# Patient Record
Sex: Female | Born: 1995 | Race: Black or African American | Hispanic: No | Marital: Single | Smoking: Never smoker
Health system: Southern US, Community
[De-identification: ages and names within clinical notes are randomized; demographics above are authoritative.]

## PROBLEM LIST (undated history)

## (undated) DIAGNOSIS — G35 Multiple sclerosis: Secondary | ICD-10-CM

## (undated) DIAGNOSIS — G43909 Migraine, unspecified, not intractable, without status migrainosus: Secondary | ICD-10-CM

## (undated) DIAGNOSIS — J45909 Unspecified asthma, uncomplicated: Secondary | ICD-10-CM

## (undated) DIAGNOSIS — R569 Unspecified convulsions: Secondary | ICD-10-CM

## (undated) HISTORY — PX: WRIST SURGERY: SHX841

## (undated) HISTORY — DX: Multiple sclerosis: G35

---

## 2017-04-16 ENCOUNTER — Emergency Department (HOSPITAL_BASED_OUTPATIENT_CLINIC_OR_DEPARTMENT_OTHER)
Admission: EM | Admit: 2017-04-16 | Discharge: 2017-04-16 | Disposition: A | Discharge status: Home / Self Care | Payer: Medicaid Other | Attending: Emergency Medicine | Admitting: Emergency Medicine

## 2017-04-16 ENCOUNTER — Encounter (HOSPITAL_BASED_OUTPATIENT_CLINIC_OR_DEPARTMENT_OTHER): Payer: Self-pay

## 2017-04-16 DIAGNOSIS — L02411 Cutaneous abscess of right axilla: Secondary | ICD-10-CM | POA: Diagnosis present

## 2017-04-16 MED ORDER — HYDROCODONE-ACETAMINOPHEN 5-325 MG PO TABS
2.0000 | ORAL_TABLET | Freq: Once | ORAL | Status: AC
  Administered 2017-04-16: 2 via ORAL
  Filled 2017-04-16: qty 2

## 2017-04-16 MED ORDER — CEPHALEXIN 250 MG PO CAPS
250.0000 mg | ORAL_CAPSULE | Freq: Once | ORAL | Status: AC
  Administered 2017-04-16: 250 mg via ORAL
  Filled 2017-04-16: qty 1

## 2017-04-16 MED ORDER — LIDOCAINE-EPINEPHRINE (PF) 2 %-1:200000 IJ SOLN
10.0000 mL | Freq: Once | INTRAMUSCULAR | Status: AC
  Administered 2017-04-16: 10 mL
  Filled 2017-04-16: qty 10

## 2017-04-16 MED ORDER — CEPHALEXIN 250 MG PO CAPS: 250.0000 mg | ORAL_CAPSULE | Freq: Four times a day (QID) | ORAL | 0 refills | Status: DC

## 2017-04-16 NOTE — ED Triage Notes (Signed)
c/o abscess x 2 to right axilla x 2 days-NAD-steady gait

## 2017-04-16 NOTE — ED Provider Notes (Signed)
[facility] Note   CSN: [id number] Arrival date & time: 04/16/17  1810  By signing my name below, I, Freida Busman, attest that this documentation has been prepared under the direction and in the presence of Roxy Horseman, PA-C. Electronically Signed: Freida Busman, Scribe. 04/16/2017. 8:23 PM.  History   Chief Complaint Chief Complaint  Patient presents with  . Abscess    The history is provided by the patient. No language interpreter was used.   HPI Comments:  Kathleen Moss is a 21 y.o. female who presents to the Emergency Department complaining of a painful "boil" to the right axilla x a few days. Pt reports h/o same, has not needed I&D in the past. Pt has no other acute complaints or associated symptoms at this time; no fever. No alleviating factors noted.   History reviewed. No pertinent past medical history.  There are no active problems to display for this patient.   Past Surgical History:  Procedure Laterality Date  . WRIST SURGERY      OB History    No data available       Home Medications    Prior to Admission medications   Not on File    Family History No family history on file.  Social History Social History  Substance Use Topics  . Smoking status: Never Smoker  . Smokeless tobacco: Never Used  . Alcohol use No     Allergies   Patient has no allergy information on record.   Review of Systems Review of Systems  Constitutional: Negative for fever.  Skin: Positive for rash (boil).   Physical Exam Updated Vital Signs BP 112/63 (BP Location: Left Arm)   Pulse 90   Temp 99.8 F (37.7 C) (Oral)   Resp 18   Ht  (1.499 m)   Wt 117 lb (53.1 kg)   LMP 03/25/2017   SpO2 100%   BMI 23.63 kg/m   Physical Exam  Constitutional: She is oriented to person, place, and time. She appears well-developed and well-nourished. No distress.  HENT:  Head: Normocephalic and atraumatic.  Eyes: Conjunctivae are normal.    Cardiovascular: Normal rate.   Pulmonary/Chest: Effort normal.  Abdominal: She exhibits no distension.  Neurological: She is alert and oriented to person, place, and time.  Skin: Skin is warm and dry.  1x3 cm right axillary abscess with mild surrounding cellulitis  Psychiatric: She has a normal mood and affect.  Nursing note and vitals reviewed.    ED Treatments / Results  DIAGNOSTIC STUDIES:  Oxygen Saturation is 100% on RA, normal by my interpretation.    COORDINATION OF CARE:  8:08 PM Discussed treatment plan with pt at bedside and pt agreed to plan.  Labs (all labs ordered are listed, but only abnormal results are displayed) Labs Reviewed - No data to display  EKG  EKG Interpretation None       Radiology No results found.  Procedures .Marland KitchenIncision and Drainage Date/Time: 04/16/2017 8:10 PM Performed by: Roxy Horseman Authorized by: Roxy Horseman   Consent:    Consent obtained:  Verbal   Consent given by:  Patient Location:    Type:  Abscess   Location: right axilla. Anesthesia (see MAR for exact dosages):    Anesthesia method:  Local infiltration   Local anesthetic:  Lidocaine 2% WITH epi Procedure details:    Incision types:  Single straight   Scalpel blade:  11   Wound management:  Probed and deloculated   Drainage:  Purulent   Drainage amount:  Moderate   Packing materials:  1/4 in iodoform gauze Post-procedure details:    Patient tolerance of procedure:  Tolerated well, no immediate complications    (including critical care time)  Medications Ordered in ED Medications  HYDROcodone-acetaminophen (NORCO/VICODIN) 5-325 MG per tablet 2 tablet (not administered)  lidocaine-EPINEPHrine (XYLOCAINE W/EPI) 2 %-1:200000 (PF) injection 10 mL (not administered)     Initial Impression / Assessment and Plan / ED Course  I have reviewed the triage vital signs and the nursing notes.  Pertinent labs & imaging results that were available during my  care of the patient were reviewed by me and considered in my medical decision making (see chart for details).      Patient with skin abscess. Incision and drainage performed in the ED today.  Packing placed. Wound recheck in 2 days. Supportive care and return precautions discussed.  Pt sent home with Keflex. The patient appears reasonably screened and/or stabilized for discharge and I doubt any other emergent medical condition requiring further screening, evaluation, or treatment in the ED prior to discharge.    Final Clinical Impressions(s) / ED Diagnoses   Final diagnoses:  Abscess of axilla, right    New Prescriptions Discharge Medication List as of 04/16/2017  9:11 PM    START taking these medications   Details  cephALEXin (KEFLEX) 250 MG capsule Take 1 capsule (250 mg total) by mouth 4 (four) times daily., Starting Mon 04/16/2017, Print       I personally performed the services described in this documentation, which was scribed in my presence. The recorded information has been reviewed and is accurate.       Roxy Horseman, PA-C 04/16/17 2251    Melene Plan, DO 04/16/17 2351

## 2017-05-08 ENCOUNTER — Emergency Department (HOSPITAL_BASED_OUTPATIENT_CLINIC_OR_DEPARTMENT_OTHER): Payer: Medicaid Other

## 2017-05-08 ENCOUNTER — Encounter (HOSPITAL_BASED_OUTPATIENT_CLINIC_OR_DEPARTMENT_OTHER): Payer: Self-pay | Admitting: Emergency Medicine

## 2017-05-08 ENCOUNTER — Emergency Department (HOSPITAL_BASED_OUTPATIENT_CLINIC_OR_DEPARTMENT_OTHER)
Admission: EM | Admit: 2017-05-08 | Discharge: 2017-05-08 | Disposition: A | Discharge status: Home / Self Care | Payer: Medicaid Other | Attending: Emergency Medicine | Admitting: Emergency Medicine

## 2017-05-08 DIAGNOSIS — M79661 Pain in right lower leg: Secondary | ICD-10-CM | POA: Insufficient documentation

## 2017-05-08 NOTE — ED Provider Notes (Signed)
4:18 PM Patient seen with Maczis PA-C. Patient with several days of right calf tenderness. No DVT risk factors.   No gross edema on exam. Patient is diffusely tender.  Ultrasound demonstrates no DVT. Patient to be discharged home with conservative measures.  BP 117/67 (BP Location: Right Arm)   Pulse 80   Temp 98.5 F (36.9 C) (Oral)   Resp 16   Ht 4\' 11"  (1.499 m)   Wt 53.1 kg (117 lb)   LMP 04/25/2017   SpO2 100%   BMI 23.63 kg/m     Renne CriglerGeiple, Noa Galvao, PA-C 05/08/17 1619    Shaune PollackIsaacs, Cameron, MD 05/09/17 [phone]

## 2017-05-08 NOTE — ED Triage Notes (Signed)
Pain to R calf since Sunday, no injury.

## 2017-05-08 NOTE — Discharge Instructions (Signed)
The ultrasound did not show evidence of blood clot. He can manage her pain with ibuprofen over-the-counter, rest, ice, elevation. Follow-up with your PCP for persistent symptoms. He can return to the emergency department if symptoms worsen or new worrisome symptoms develop.

## 2017-05-08 NOTE — ED Provider Notes (Signed)
[facility]   CSN: [id number] Arrival date & time: 05/08/17  1247     History   Chief [id number] Chief Complaint  Patient presents with  . Leg Pain    HPI Kathleen Moss is a 21 y.o. female with no significant past medical history [facility] pain that onset Sunday. The patient notes that she was walking at church when the pain began. She is unable to describe the pain. The pain is worse with ambulation, pressure, movement, rated 8 out of 10. She has not taken anything for the pain. She denies recent travel by plane, long travel in car, immobilization, recent surgery, cancer, hormone replacement therapy, birth control, history of blood clot, trauma, smoking history, IV drug use.  HPI  History reviewed. No pertinent past medical history.  There are no active problems to display for this patient.   Past Surgical History:  Procedure Laterality Date  . WRIST SURGERY      OB History    No data available       Home Medications    Prior to Admission medications   Medication Sig Start Date End Date Taking? Authorizing Provider  cephALEXin (KEFLEX) 250 MG capsule Take 1 capsule (250 mg total) by mouth 4 (four) times daily. 04/16/17   Roxy HorsemanBrowning, Robert, PA-C    Family History No family history on file.  Social History Social History  Substance Use Topics  . Smoking status: Never Smoker  . Smokeless tobacco: Never Used  . Alcohol use No     Allergies   Patient has no known allergies.   Review of Systems Review of Systems  All other systems reviewed and are negative.    Physical Exam Updated Vital Signs BP 117/67 (BP Location: Right Arm)   Pulse 80   Temp 98.5 F (36.9 C) (Oral)   Resp 16   Ht 4\' 11"  (1.499 m)   Wt 53.1 kg (117 lb)   LMP 04/25/2017   SpO2 100%   BMI 23.63 kg/m   Physical Exam  Constitutional: She appears well-developed and well-nourished.  HENT:  Head:  Normocephalic and atraumatic.  Eyes: Conjunctivae are normal. Right eye exhibits no discharge. Left eye exhibits no discharge. No scleral icterus.  Cardiovascular: Normal rate, regular rhythm and normal heart sounds.   Pulmonary/Chest: Effort normal and breath sounds normal. No respiratory distress.  Musculoskeletal:       Right knee: Normal.       Left knee: Normal.       Right ankle: Normal.       Left ankle: Normal.       Right lower leg: She exhibits tenderness and swelling. She exhibits no edema and no deformity.       Left lower leg: Normal.  Neurological: She is alert.  Skin: Skin is warm and dry. No erythema. No pallor.  Psychiatric: She has a normal mood and affect.  Nursing note and vitals reviewed.    ED Treatments / Results  Labs (all labs ordered are listed, but only abnormal results are displayed) Labs Reviewed - No data to display  EKG  EKG Interpretation None       Radiology [place] Venous Img Lower Unilateral Right  Result Date: 05/08/2017 CLINICAL DATA:  21 y/o F; right calf and knee pain with swelling for 3 days. EXAM: RIGHT LOWER EXTREMITY VENOUS DOPPLER ULTRASOUND TECHNIQUE: Gray-scale sonography with graded compression, as well as color Doppler and duplex ultrasound were  performed to evaluate the lower extremity deep venous systems from the level of the common femoral vein and including the common femoral, femoral, profunda femoral, popliteal and calf veins including the posterior tibial, peroneal and gastrocnemius veins when visible. The superficial great saphenous vein was also interrogated. Spectral Doppler was utilized to evaluate flow at rest and with distal augmentation maneuvers in the common femoral, femoral and popliteal veins. COMPARISON:  None. FINDINGS: Contralateral Common Femoral Vein: Respiratory phasicity is normal and symmetric with the symptomatic side. No evidence of thrombus. Normal compressibility. Common Femoral Vein: No evidence of thrombus.  Normal compressibility, respiratory phasicity and response to augmentation. Saphenofemoral Junction: No evidence of thrombus. Normal compressibility and flow on color Doppler imaging. Profunda Femoral Vein: No evidence of thrombus. Normal compressibility and flow on color Doppler imaging. Femoral Vein: No evidence of thrombus. Normal compressibility, respiratory phasicity and response to augmentation. Popliteal Vein: No evidence of thrombus. Normal compressibility, respiratory phasicity and response to augmentation. Calf Veins: No evidence of thrombus. Normal compressibility and flow on color Doppler imaging. Superficial Great Saphenous Vein: No evidence of thrombus. Normal compressibility and flow on color Doppler imaging. Venous Reflux:  None. Other Findings:  None. IMPRESSION: No evidence of DVT within the right lower extremity. Electronically Signed   By: Mitzi Hansen M.D.   On: 05/08/2017 16:16    Procedures Procedures (including critical care time)  Medications Ordered in ED Medications - No data to display   Initial Impression / Assessment and Plan / ED Course  I have reviewed the triage vital signs and the nursing notes.  Pertinent labs & imaging results that were available during my care of the patient were reviewed by me and considered in my medical decision making (see chart for details).     Patient's history suspicious for DVT. Ordered a Doppler ultrasound of lower extremity Which did not reveal evidence of a DVT within the right lower extremity. Discussed these findings with the patient. Advised the patient to manage her pain with ibuprofen, rest, ice, elevation. The patient stated understanding. Advised her to follow-up with her PCP for persistent symptoms. She can return to the emergency department for worsening or new worrisome symptoms.  Final Clinical Impressions(s) / ED Diagnoses   Final diagnoses:  Right calf pain    New Prescriptions New Prescriptions   No  medications on file     [name] 05/08/17 1625    Charlynne Pander, MD 05/09/17 [phone]

## 2017-05-27 ENCOUNTER — Emergency Department (HOSPITAL_BASED_OUTPATIENT_CLINIC_OR_DEPARTMENT_OTHER): Payer: Medicaid Other

## 2017-05-27 ENCOUNTER — Encounter (HOSPITAL_BASED_OUTPATIENT_CLINIC_OR_DEPARTMENT_OTHER): Payer: Self-pay | Admitting: Emergency Medicine

## 2017-05-27 ENCOUNTER — Emergency Department (HOSPITAL_BASED_OUTPATIENT_CLINIC_OR_DEPARTMENT_OTHER)
Admission: EM | Admit: 2017-05-27 | Discharge: 2017-05-27 | Disposition: A | Discharge status: Home / Self Care | Payer: Medicaid Other | Attending: Emergency Medicine | Admitting: Emergency Medicine

## 2017-05-27 DIAGNOSIS — A598 Trichomoniasis of other sites: Secondary | ICD-10-CM | POA: Diagnosis not present

## 2017-05-27 DIAGNOSIS — R69 Illness, unspecified: Secondary | ICD-10-CM

## 2017-05-27 DIAGNOSIS — A599 Trichomoniasis, unspecified: Secondary | ICD-10-CM

## 2017-05-27 DIAGNOSIS — R05 Cough: Secondary | ICD-10-CM | POA: Diagnosis present

## 2017-05-27 DIAGNOSIS — J111 Influenza due to unidentified influenza virus with other respiratory manifestations: Secondary | ICD-10-CM

## 2017-05-27 DIAGNOSIS — R509 Fever, unspecified: Secondary | ICD-10-CM | POA: Diagnosis not present

## 2017-05-27 LAB — URINALYSIS, ROUTINE W REFLEX MICROSCOPIC
GLUCOSE, UA: NEGATIVE mg/dL
Ketones, ur: 15 mg/dL — AB
Nitrite: NEGATIVE
Protein, ur: NEGATIVE mg/dL
SPECIFIC GRAVITY, URINE: 1.038 — AB (ref 1.005–1.030)
pH: 5.5 (ref 5.0–8.0)

## 2017-05-27 LAB — URINALYSIS, MICROSCOPIC (REFLEX)

## 2017-05-27 LAB — WET PREP, GENITAL
Clue Cells Wet Prep HPF POC: NONE SEEN
SPERM: NONE SEEN
YEAST WET PREP: NONE SEEN

## 2017-05-27 LAB — PREGNANCY, URINE: PREG TEST UR: NEGATIVE

## 2017-05-27 MED ORDER — LIDOCAINE HCL (PF) 1 % IJ SOLN
INTRAMUSCULAR | Status: AC
  Administered 2017-05-27: 5 mL
  Filled 2017-05-27: qty 5

## 2017-05-27 MED ORDER — ONDANSETRON 4 MG PO TBDP
4.0000 mg | ORAL_TABLET | Freq: Once | ORAL | Status: AC
  Administered 2017-05-27: 4 mg via ORAL
  Filled 2017-05-27: qty 1

## 2017-05-27 MED ORDER — CEFTRIAXONE SODIUM 250 MG IJ SOLR
250.0000 mg | Freq: Once | INTRAMUSCULAR | Status: AC
  Administered 2017-05-27: 250 mg via INTRAMUSCULAR
  Filled 2017-05-27: qty 250

## 2017-05-27 MED ORDER — METRONIDAZOLE 500 MG PO TABS: 500.0000 mg | ORAL_TABLET | Freq: Two times a day (BID) | ORAL | 0 refills | Status: DC

## 2017-05-27 MED ORDER — AZITHROMYCIN 250 MG PO TABS
1000.0000 mg | ORAL_TABLET | Freq: Once | ORAL | Status: AC
  Administered 2017-05-27: 1000 mg via ORAL
  Filled 2017-05-27: qty 4

## 2017-05-27 NOTE — ED Triage Notes (Signed)
Patient states that she has had fever and cough x 1 week. Reports intermittent fever that is controlled with OTC medications. The patient also reports vomiting last night and lower pelvic pain

## 2017-05-27 NOTE — Discharge Instructions (Signed)
Take tylenol 2 pills 4 times a day and motrin 4 pills 3 times a day.  Drink plenty of fluids.  Return for worsening shortness of breath, headache, confusion. Follow up with your family doctor.   

## 2017-05-27 NOTE — ED Provider Notes (Signed)
[facility]   CSN: [id number] Arrival date & time: 05/27/17  4098     History   Chief Complaint Chief Complaint  Patient presents with  . Cough    HPI Kathleen Moss is a 21 y.o. female.  21 yo F with a chief complaint of cough congestion fevers chills and myalgias. Going on for the past 5 days or so. Feels that it's slightly worsening. She has also complaining of pelvic discharge. She did just start her menstrual cycle so is not sure if it continued. Is having some lower pelvic pain. Denies flank pain. Had an episode of vomiting and diarrhea this morning. Multiple members in the family with the same illness.   The history is provided by the patient.  Cough  This is a new problem. The current episode started more than 2 days ago. The problem occurs constantly. The problem has been rapidly worsening. The cough is non-productive. The maximum temperature recorded prior to her arrival was 102 to 102.9 F. The fever has been present for 3 to 4 days. Associated symptoms include chills and myalgias. Pertinent negatives include no chest pain, no headaches, no rhinorrhea, no shortness of breath, no wheezing and no eye redness. She has tried nothing for the symptoms. The treatment provided no relief. She is not a smoker.    History reviewed. No pertinent past medical history.  There are no active problems to display for this patient.   Past Surgical History:  Procedure Laterality Date  . WRIST SURGERY      OB History    No data available       Home Medications    Prior to Admission medications   Medication Sig Start Date End Date Taking? Authorizing Provider  cephALEXin (KEFLEX) 250 MG capsule Take 1 capsule (250 mg total) by mouth 4 (four) times daily. 04/16/17   Roxy Horseman, PA-C  metroNIDAZOLE (FLAGYL) 500 MG tablet Take 1 tablet (500 mg total) by mouth 2 (two) times daily. One po bid x 7 days 05/27/17   Melene Plan, DO    Family  History History reviewed. No pertinent family history.  Social History Social History  Substance Use Topics  . Smoking status: Never Smoker  . Smokeless tobacco: Never Used  . Alcohol use No     Allergies   Patient has no known allergies.   Review of Systems Review of Systems  Constitutional: Positive for chills and fever.  HENT: Negative for congestion and rhinorrhea.   Eyes: Negative for redness and visual disturbance.  Respiratory: Positive for cough. Negative for shortness of breath and wheezing.   Cardiovascular: Negative for chest pain and palpitations.  Gastrointestinal: Negative for nausea and vomiting.  Genitourinary: Positive for pelvic pain, vaginal bleeding and vaginal discharge. Negative for dysuria and urgency.  Musculoskeletal: Positive for myalgias. Negative for arthralgias.  Skin: Negative for pallor and wound.  Neurological: Negative for dizziness and headaches.     Physical Exam Updated Vital Signs BP 113/66 (BP Location: Right Arm)   Pulse 88   Temp 98.8 F (37.1 C) (Oral)   Resp 18   Ht 4\' 11"  (1.499 m)   Wt 53.5 kg (118 lb)   LMP 05/22/2017   SpO2 100%   BMI 23.83 kg/m   Physical Exam  Constitutional: She is oriented to person, place, and time. She appears well-developed and well-nourished. No distress.  HENT:  Head: Normocephalic and atraumatic.  Swollen turbinates, posterior nasal drip, no noted sinus ttp, tm normal  bilaterally.    Eyes: EOM are normal. Pupils are equal, round, and reactive to light.  Neck: Normal range of motion. Neck supple.  Cardiovascular: Normal rate and regular rhythm.  Exam reveals no gallop and no friction rub.   No murmur heard. Pulmonary/Chest: Effort normal. She has no wheezes. She has no rales.  Abdominal: Soft. She exhibits no distension and no mass. There is no tenderness. There is no guarding.  Musculoskeletal: She exhibits no edema or tenderness.  Neurological: She is alert and oriented to person,  place, and time.  Skin: Skin is warm and dry. She is not diaphoretic.  Psychiatric: She has a normal mood and affect. Her behavior is normal.  Nursing note and vitals reviewed.    ED Treatments / Results  Labs (all labs ordered are listed, but only abnormal results are displayed) Labs Reviewed  WET PREP, GENITAL - Abnormal; Notable for the following:       Result Value   Trich, Wet Prep PRESENT (*)    WBC, Wet Prep HPF POC MANY (*)    All other components within normal limits  URINALYSIS, ROUTINE W REFLEX MICROSCOPIC - Abnormal; Notable for the following:    Specific Gravity, Urine 1.038 (*)    Hgb urine dipstick SMALL (*)    Bilirubin Urine SMALL (*)    Ketones, ur 15 (*)    Leukocytes, UA TRACE (*)    All other components within normal limits  URINALYSIS, MICROSCOPIC (REFLEX) - Abnormal; Notable for the following:    Bacteria, UA MANY (*)    Squamous Epithelial / LPF 6-30 (*)    All other components within normal limits  PREGNANCY, URINE  RPR  HIV ANTIBODY (ROUTINE TESTING)  GC/CHLAMYDIA PROBE AMP ([place]) NOT AT [facility]    EKG  EKG Interpretation None       Radiology Dg Chest 2 View  Result Date: 05/27/2017 CLINICAL DATA:  Productive cough and fever for 4 days. EXAM: CHEST  2 VIEW COMPARISON:  None. FINDINGS: The lungs are clear. Heart size is normal. There is no pneumothorax or pleural fluid. No bony abnormality. IMPRESSION: Negative chest. Electronically Signed   By: Drusilla Kanner M.D.   On: 05/27/2017 10:28    Procedures Procedures (including critical care time)  Medications Ordered in ED Medications  ondansetron (ZOFRAN-ODT) disintegrating tablet 4 mg (4 mg Oral Given 05/27/17 1154)  cefTRIAXone (ROCEPHIN) injection 250 mg (250 mg Intramuscular Given 05/27/17 1216)  azithromycin (ZITHROMAX) tablet 1,000 mg (1,000 mg Oral Given 05/27/17 1216)  lidocaine (PF) (XYLOCAINE) 1 % injection (5 mLs  Given 05/27/17 1216)     Initial Impression / Assessment and  Plan / ED Course  I have reviewed the triage vital signs and the nursing notes.  Pertinent labs & imaging results that were available during my care of the patient were reviewed by me and considered in my medical decision making (see chart for details).     21 yo F  With an influenza-like illness. Chest x-ray obtained due to length of symptoms. Patient also complaining of pelvic complaints.   Trich on wet prep, treat for GC.   12:51 PM:  I have discussed the diagnosis/risks/treatment options with the patient and family and believe the pt to be eligible for discharge home to follow-up with PCP. We also discussed returning to the ED immediately if new or worsening sx occur. We discussed the sx which are most concerning (e.g., sudden worsening pain, fever, inability to tolerate by mouth) that necessitate  immediate return. Medications administered to the patient during their visit and any new prescriptions provided to the patient are listed below.  Medications given during this visit Medications  ondansetron (ZOFRAN-ODT) disintegrating tablet 4 mg (4 mg Oral Given 05/27/17 1154)  cefTRIAXone (ROCEPHIN) injection 250 mg (250 mg Intramuscular Given 05/27/17 1216)  azithromycin (ZITHROMAX) tablet 1,000 mg (1,000 mg Oral Given 05/27/17 1216)  lidocaine (PF) (XYLOCAINE) 1 % injection (5 mLs  Given 05/27/17 1216)     The patient appears reasonably screen and/or stabilized for discharge and I doubt any other medical condition or other [facility] requiring further screening, evaluation, or treatment in the ED at this time prior to discharge.      Final Clinical Impressions(s) / ED Diagnoses   Final diagnoses:  Trichomonas infection  Influenza-like illness    New Prescriptions New Prescriptions   METRONIDAZOLE (FLAGYL) 500 MG TABLET    Take 1 tablet (500 mg total) by mouth 2 (two) times daily. One po bid x 7 days     Melene PlanFloyd, Blayke Cordrey, DO 05/27/17 1251

## 2017-05-28 LAB — HIV ANTIBODY (ROUTINE TESTING W REFLEX): HIV Screen 4th Generation wRfx: NONREACTIVE

## 2017-05-28 LAB — RPR: RPR Ser Ql: NONREACTIVE

## 2017-05-28 LAB — GC/CHLAMYDIA PROBE AMP (~~LOC~~) NOT AT ARMC
CHLAMYDIA, DNA PROBE: NEGATIVE
Neisseria Gonorrhea: NEGATIVE

## 2017-10-11 ENCOUNTER — Emergency Department (HOSPITAL_BASED_OUTPATIENT_CLINIC_OR_DEPARTMENT_OTHER)
Admission: EM | Admit: 2017-10-11 | Discharge: 2017-10-11 | Disposition: A | Discharge status: Home / Self Care | Payer: Self-pay | Attending: Emergency Medicine | Admitting: Emergency Medicine

## 2017-10-11 ENCOUNTER — Encounter (HOSPITAL_BASED_OUTPATIENT_CLINIC_OR_DEPARTMENT_OTHER): Payer: Self-pay | Admitting: Emergency Medicine

## 2017-10-11 DIAGNOSIS — G43009 Migraine without aura, not intractable, without status migrainosus: Secondary | ICD-10-CM | POA: Insufficient documentation

## 2017-10-11 DIAGNOSIS — Z79899 Other long term (current) drug therapy: Secondary | ICD-10-CM | POA: Insufficient documentation

## 2017-10-11 DIAGNOSIS — J45909 Unspecified asthma, uncomplicated: Secondary | ICD-10-CM | POA: Insufficient documentation

## 2017-10-11 HISTORY — DX: Migraine, unspecified, not intractable, without status migrainosus: G43.909

## 2017-10-11 HISTORY — DX: Unspecified asthma, uncomplicated: J45.909

## 2017-10-11 MED ORDER — DIPHENHYDRAMINE HCL 25 MG PO CAPS
25.0000 mg | ORAL_CAPSULE | Freq: Once | ORAL | Status: AC
  Administered 2017-10-11: 25 mg via ORAL
  Filled 2017-10-11: qty 1

## 2017-10-11 MED ORDER — DIPHENHYDRAMINE HCL 50 MG/ML IJ SOLN: 25.0000 mg | Freq: Once | INTRAMUSCULAR | Status: DC

## 2017-10-11 MED ORDER — ACETAMINOPHEN 500 MG PO TABS
1000.0000 mg | ORAL_TABLET | Freq: Once | ORAL | Status: AC
  Administered 2017-10-11: 1000 mg via ORAL
  Filled 2017-10-11: qty 2

## 2017-10-11 MED ORDER — KETOROLAC TROMETHAMINE 15 MG/ML IJ SOLN
15.0000 mg | Freq: Once | INTRAMUSCULAR | Status: AC
  Administered 2017-10-11: 15 mg via INTRAVENOUS
  Filled 2017-10-11: qty 1

## 2017-10-11 MED ORDER — MAGNESIUM SULFATE 2 GM/50ML IV SOLN
2.0000 g | Freq: Once | INTRAVENOUS | Status: AC
  Administered 2017-10-11: 2 g via INTRAVENOUS
  Filled 2017-10-11: qty 50

## 2017-10-11 MED ORDER — ONDANSETRON HCL 4 MG/2ML IJ SOLN
4.0000 mg | Freq: Once | INTRAMUSCULAR | Status: AC
Start: 2017-10-11 — End: 2017-10-11
  Administered 2017-10-11: 4 mg via INTRAVENOUS
  Filled 2017-10-11: qty 2

## 2017-10-11 MED ORDER — DEXAMETHASONE SODIUM PHOSPHATE 10 MG/ML IJ SOLN
10.0000 mg | Freq: Once | INTRAMUSCULAR | Status: AC
  Administered 2017-10-11: 10 mg via INTRAVENOUS
  Filled 2017-10-11: qty 1

## 2017-10-11 MED ORDER — PROCHLORPERAZINE EDISYLATE 5 MG/ML IJ SOLN
10.0000 mg | Freq: Once | INTRAMUSCULAR | Status: AC
  Administered 2017-10-11: 10 mg via INTRAVENOUS
  Filled 2017-10-11: qty 2

## 2017-10-11 NOTE — ED Triage Notes (Signed)
Pt c/o severe posterior HA since yesterday; took Excedrin at home w/o relief

## 2017-10-11 NOTE — ED Notes (Signed)
Pt vomited approx 3 minutes after receiving Compazine, while Decadron was being administered. Pt denies NV prior to this episode.

## 2017-10-11 NOTE — ED Provider Notes (Signed)
[facility] EMERGENCY DEPARTMENT Provider Note  CSN: [id number] Arrival date & time: 10/11/17 1018  Chief Complaint(s) Migraine  HPI Kathleen Moss is a 21 y.o. female   The history is provided by the patient.  Migraine  This is a recurrent problem. The current episode started yesterday. The problem occurs constantly. The problem has been gradually worsening. Pertinent negatives include no chest pain, no abdominal pain and no shortness of breath. Exacerbated by: light, loud sounds. Nothing relieves the symptoms. Treatments tried: Exedrine. Improvement on treatment: typically improves but has not helped with this headache.   Similar to prior migraine headaches. No recent fevers, chills, URI sx, trauma, focal weakness.   Past Medical History Past Medical History:  Diagnosis Date  . Asthma   . Migraines    There are no active problems to display for this patient.  Home Medication(s) Prior to Admission medications   Medication Sig Start Date End Date Taking? Authorizing Provider  cephALEXin (KEFLEX) 250 MG capsule Take 1 capsule (250 mg total) by mouth 4 (four) times daily. 04/16/17   Roxy HorsemanBrowning, Robert, PA-C  metroNIDAZOLE (FLAGYL) 500 MG tablet Take 1 tablet (500 mg total) by mouth 2 (two) times daily. One po bid x 7 days 05/27/17   Melene PlanFloyd, Dan, DO                                                                                                                                    Past Surgical History Past Surgical History:  Procedure Laterality Date  . WRIST SURGERY     Family History No family history on file.  Social History Social History  Substance Use Topics  . Smoking status: Never Smoker  . Smokeless tobacco: Never Used  . Alcohol use No   Allergies Patient has no known allergies.  Review of Systems Review of Systems  Respiratory: Negative for shortness of breath.   Cardiovascular: Negative for chest pain.  Gastrointestinal: Negative for abdominal pain.    All other systems are reviewed and are negative for acute change except as noted in the HPI  Physical Exam Vital Signs  I have reviewed the triage vital signs BP 122/69 (BP Location: Right Arm)   Pulse 68   Temp 99.2 F (37.3 C) (Oral)   Resp 16   Ht 4\' 11"  (1.499 m)   Wt 54.4 kg (120 lb)   LMP 09/24/2017   SpO2 100%   BMI 24.24 kg/m   Physical Exam  Constitutional: She is oriented to person, place, and time. She appears well-developed and well-nourished. No distress.  HENT:  Head: Normocephalic and atraumatic.  Right Ear: External ear normal.  Left Ear: External ear normal.  Nose: Nose normal.  Eyes: Conjunctivae and EOM are normal. No scleral icterus.  Neck: Normal range of motion and phonation normal.  Cardiovascular: Normal rate and regular rhythm.   Pulmonary/Chest: Effort normal. No stridor. No respiratory distress.  Abdominal: She  exhibits no distension.  Musculoskeletal: Normal range of motion. She exhibits no edema.  Neurological: She is alert and oriented to person, place, and time.  Mental Status:  Alert and oriented to person, place, and time.  Attention and concentration normal.  Speech clear.  Recent memory is intact  Cranial Nerves:  II Visual Fields: Intact to confrontation. Visual fields intact. III, IV, VI: Pupils equal and reactive to light and near. Full eye movement without nystagmus  V Facial Sensation: Normal. No weakness of masticatory muscles  VII: No facial weakness or asymmetry  VIII Auditory Acuity: Grossly normal  IX/X: The uvula is midline; the palate elevates symmetrically  XI: Normal sternocleidomastoid and trapezius strength  XII: The tongue is midline. No atrophy or fasciculations.   Motor System: Muscle Strength: 5/5 and symmetric in the upper and lower extremities. No pronation or drift.  Muscle Tone: Tone and muscle bulk are normal in the upper and lower extremities.   Reflexes: DTRs: 1+ and symmetrical in all four extremities.  No Clonus Coordination: Intact finger-to-nose, heel-to-shin. No tremor.  Sensation: Intact to light touch, and pinprick.  Gait: Routine gait normal.   Skin: She is not diaphoretic.  Psychiatric: She has a normal mood and affect. Her behavior is normal.  Vitals reviewed.   ED Results and Treatments Labs (all labs ordered are listed, but only abnormal results are displayed) Labs Reviewed - No data to display                                                                                                                       EKG  EKG Interpretation  Date/Time:    Ventricular Rate:    PR Interval:    QRS Duration:   QT Interval:    QTC Calculation:   R Axis:     Text Interpretation:        Radiology No results found. Pertinent labs & imaging results that were available during my care of the patient were reviewed by me and considered in my medical decision making (see chart for details).  Medications Ordered in ED Medications  prochlorperazine (COMPAZINE) injection 10 mg (10 mg Intravenous Given 10/11/17 1057)  dexamethasone (DECADRON) injection 10 mg (10 mg Intravenous Given 10/11/17 1101)  acetaminophen (TYLENOL) tablet 1,000 mg (1,000 mg Oral Given 10/11/17 1048)  diphenhydrAMINE (BENADRYL) capsule 25 mg (25 mg Oral Given 10/11/17 1048)  ondansetron (ZOFRAN) injection 4 mg (4 mg Intravenous Given 10/11/17 1112)  magnesium sulfate IVPB 2 g 50 mL (0 g Intravenous Stopped 10/11/17 1156)  ketorolac (TORADOL) 15 MG/ML injection 15 mg (15 mg Intravenous Given 10/11/17 1137)  Procedures Procedures  (including critical care time)  Medical Decision Making / ED Course I have reviewed the nursing notes for this encounter and the patient's prior records (if available in EHR or on provided paperwork).     10:53 AM Typical migraine headache for the pt.  Non focal neuro exam. No recent head trauma. No fever. Doubt meningitis. Doubt intracranial bleed. Doubt IIH. No indication for imaging. Will treat with migraine cocktail and reevaluate.  11:29 AM Minimal improvement with Compazine, Benadryl, Decadron.  Will give patient Toradol and magnesium.   12:06 PM Significant improvement per the patient.  The patient is safe for discharge with strict return precautions.   Final Clinical Impression(s) / ED Diagnoses Final diagnoses:  Migraine without aura and without status migrainosus, not intractable    Disposition: Discharge  Condition: Good  I have discussed the results, Dx and Tx plan with the patient who expressed understanding and agree(s) with the plan. Discharge instructions discussed at great length. The patient was given strict return precautions who verbalized understanding of the instructions. No further questions at time of discharge.    New Prescriptions   No medications on file    Follow Up: Primary care provider  Schedule an appointment as soon as possible for a visit  As needed     This chart was dictated using voice recognition software.  Despite best efforts to proofread,  errors can occur which can change the documentation meaning.   Nira Conn, MD 10/11/17 [id number]

## 2017-12-09 IMAGING — US US EXTREM LOW VENOUS*R*
1 series · 13 of 24 positions shown · non-contrast
Comparison: None.

CLINICAL DATA: 21 y/o F; right calf and knee pain with swelling for
3 days.



[Series 1: us extrem low venous*right* · 0.05mm/px · 13 of 28 slices shown]
[im 1/28]
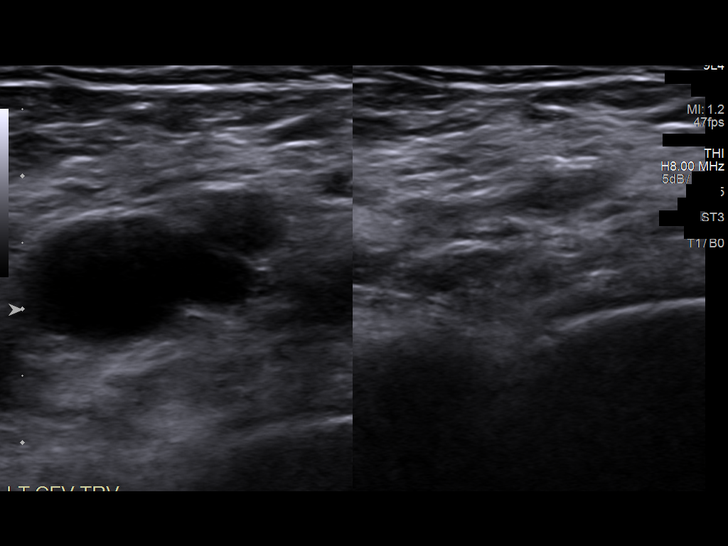
[im 3/28]
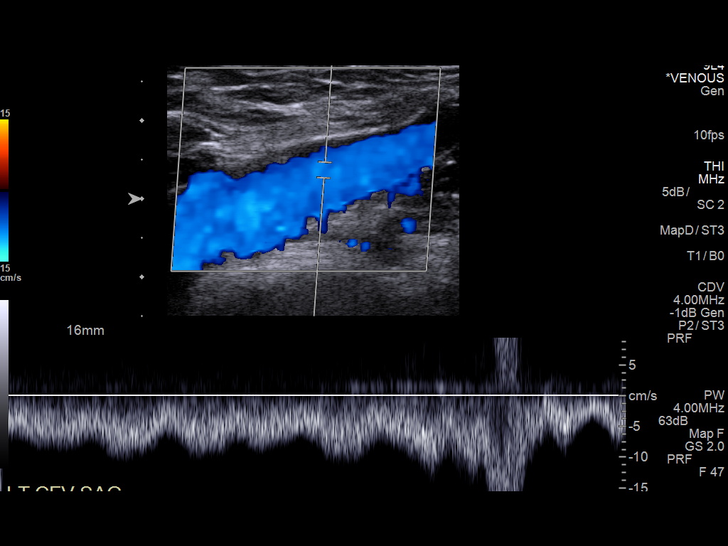
[im 5/28]
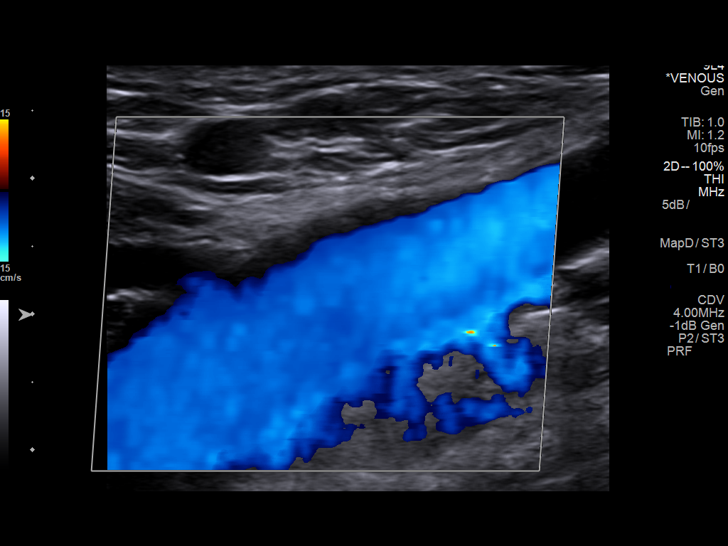
[im 8/28]
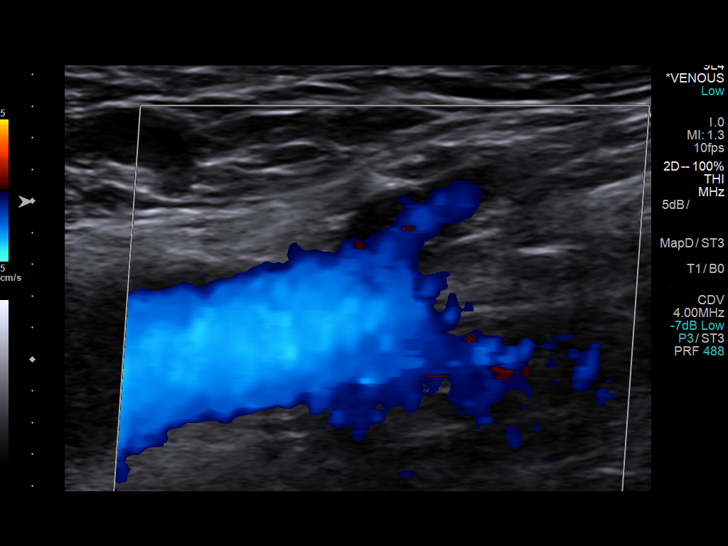
[im 10/28]
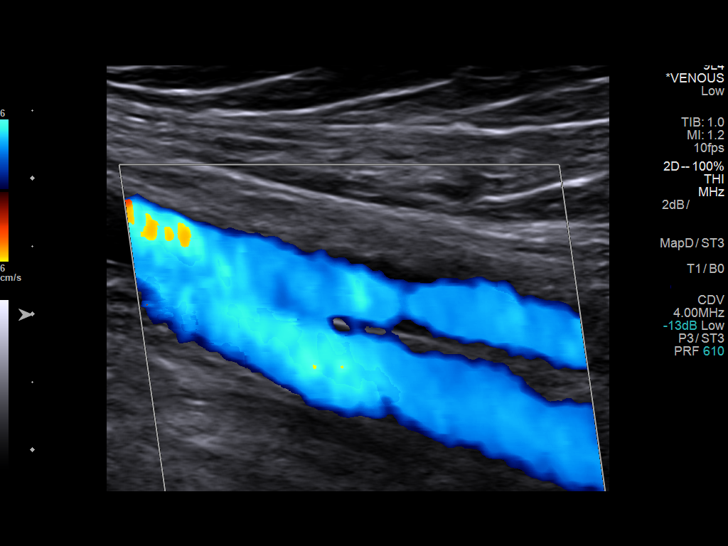
[im 12/28]
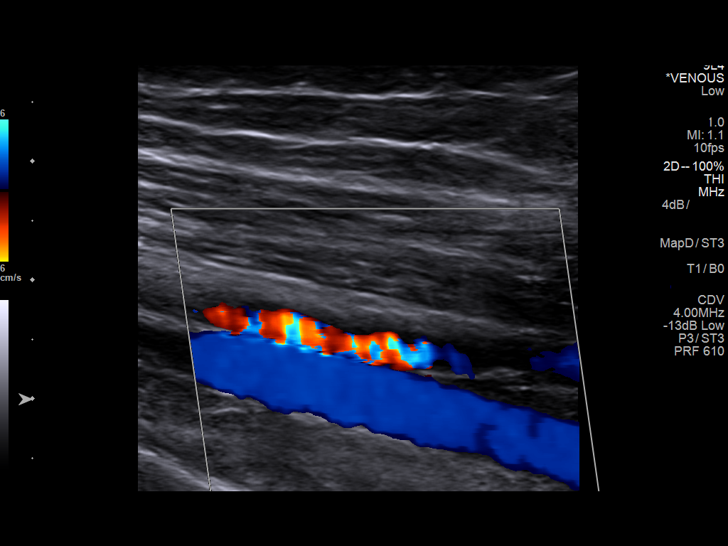
[im 15/28]
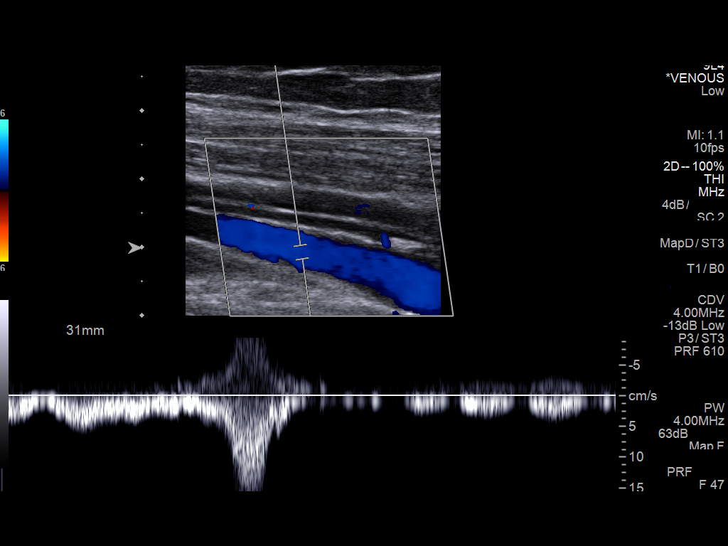
[im 16/28]
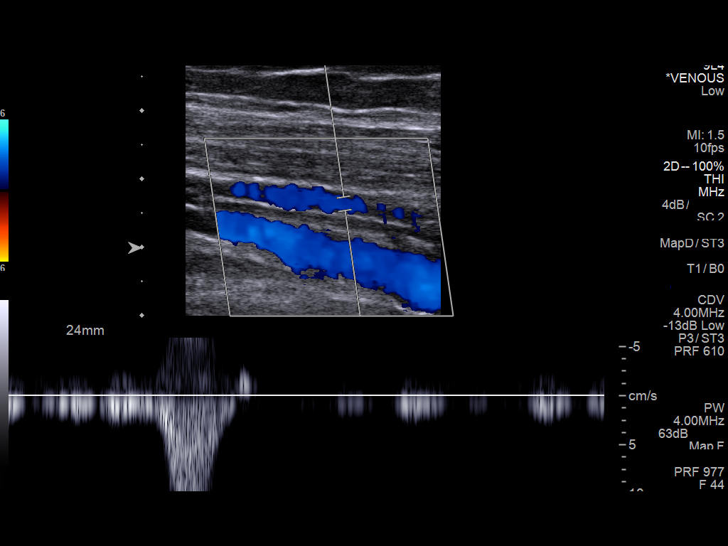
[im 18/28]
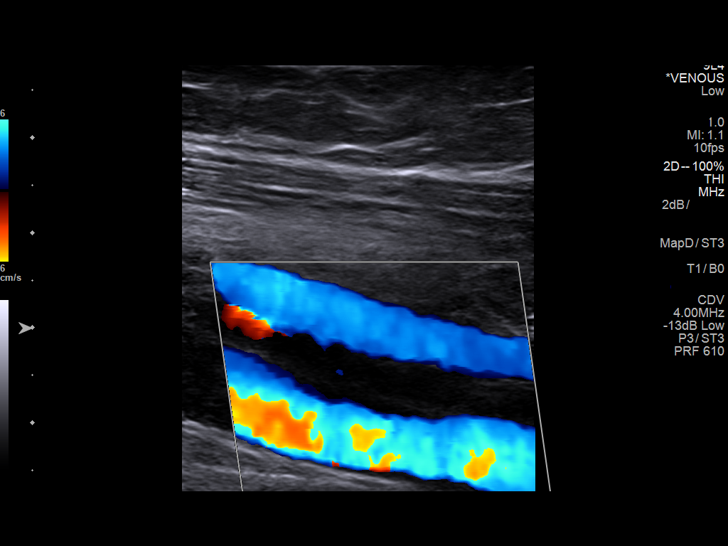
[im 20/28]
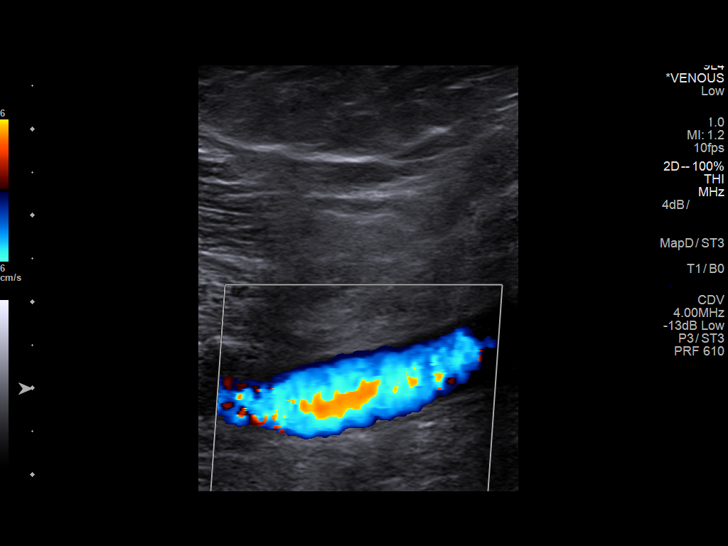
[im 23/28]
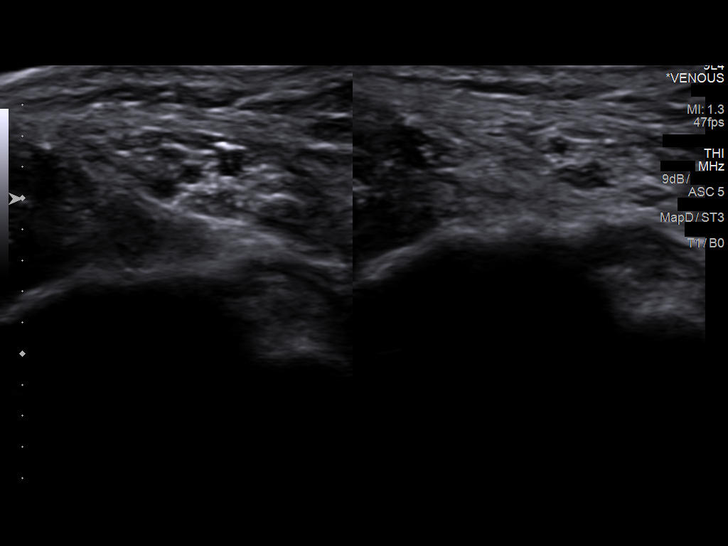
[im 25/28]
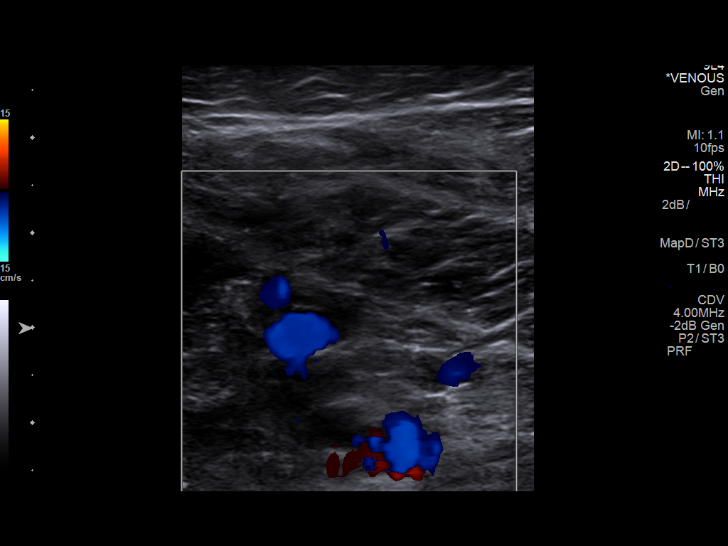
[im 28/28]
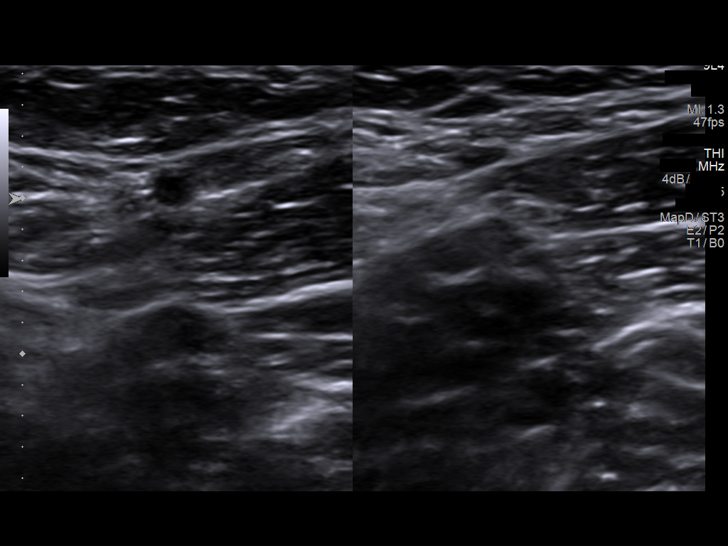

[13 of 24 positions shown; findings below may reference images not displayed]

FINDINGS: Contralateral Common Femoral Vein: Respiratory phasicity is normal
and symmetric with the symptomatic side. No evidence of thrombus.
Normal compressibility.

Common Femoral Vein: No evidence of thrombus. Normal
compressibility, respiratory phasicity and response to augmentation.

Saphenofemoral Junction: No evidence of thrombus. Normal
compressibility and flow on color Doppler imaging.

Profunda Femoral Vein: No evidence of thrombus. Normal
compressibility and flow on color Doppler imaging.

Femoral Vein: No evidence of thrombus. Normal compressibility,
respiratory phasicity and response to augmentation.

Popliteal Vein: No evidence of thrombus. Normal compressibility,
respiratory phasicity and response to augmentation.

Calf Veins: No evidence of thrombus. Normal compressibility and flow
on color Doppler imaging.

Superficial Great Saphenous Vein: No evidence of thrombus. Normal
compressibility and flow on color Doppler imaging.

Venous Reflux:  None.

Other Findings:  None.
IMPRESSION: No evidence of DVT within the right lower extremity.

By: Author Sampayo M.D.

## 2018-01-17 ENCOUNTER — Other Ambulatory Visit: Payer: Self-pay

## 2018-01-17 ENCOUNTER — Encounter (HOSPITAL_BASED_OUTPATIENT_CLINIC_OR_DEPARTMENT_OTHER): Payer: Self-pay

## 2018-01-17 ENCOUNTER — Emergency Department (HOSPITAL_BASED_OUTPATIENT_CLINIC_OR_DEPARTMENT_OTHER)
Admission: EM | Admit: 2018-01-17 | Discharge: 2018-01-17 | Disposition: A | Discharge status: Home / Self Care | Payer: Medicaid Other | Attending: Physician Assistant | Admitting: Physician Assistant

## 2018-01-17 ENCOUNTER — Emergency Department (HOSPITAL_BASED_OUTPATIENT_CLINIC_OR_DEPARTMENT_OTHER): Payer: Medicaid Other

## 2018-01-17 DIAGNOSIS — Z3A01 Less than 8 weeks gestation of pregnancy: Secondary | ICD-10-CM | POA: Insufficient documentation

## 2018-01-17 DIAGNOSIS — R102 Pelvic and perineal pain: Secondary | ICD-10-CM | POA: Diagnosis not present

## 2018-01-17 DIAGNOSIS — O208 Other hemorrhage in early pregnancy: Secondary | ICD-10-CM

## 2018-01-17 DIAGNOSIS — O209 Hemorrhage in early pregnancy, unspecified: Secondary | ICD-10-CM

## 2018-01-17 DIAGNOSIS — J45909 Unspecified asthma, uncomplicated: Secondary | ICD-10-CM | POA: Insufficient documentation

## 2018-01-17 LAB — CBC WITH DIFFERENTIAL/PLATELET
Basophils Absolute: 0 10*3/uL (ref 0.0–0.1)
Basophils Relative: 0 %
Eosinophils Absolute: 0.1 10*3/uL (ref 0.0–0.7)
Eosinophils Relative: 1 %
HCT: 35.2 % — ABNORMAL LOW (ref 36.0–46.0)
Hemoglobin: 11.4 g/dL — ABNORMAL LOW (ref 12.0–15.0)
Lymphocytes Relative: 25 %
Lymphs Abs: 3 10*3/uL (ref 0.7–4.0)
MCH: 26 pg (ref 26.0–34.0)
MCHC: 32.4 g/dL (ref 30.0–36.0)
MCV: 80.2 fL (ref 78.0–100.0)
Monocytes Absolute: 0.8 10*3/uL (ref 0.1–1.0)
Monocytes Relative: 6 %
Neutro Abs: 8.3 10*3/uL — ABNORMAL HIGH (ref 1.7–7.7)
Neutrophils Relative %: 68 %
Platelets: 281 10*3/uL (ref 150–400)
RBC: 4.39 MIL/uL (ref 3.87–5.11)
RDW: 14.8 % (ref 11.5–15.5)
WBC: 12.1 10*3/uL — ABNORMAL HIGH (ref 4.0–10.5)

## 2018-01-17 LAB — COMPREHENSIVE METABOLIC PANEL
ALT: 10 U/L — ABNORMAL LOW (ref 14–54)
AST: 21 U/L (ref 15–41)
Albumin: 4.1 g/dL (ref 3.5–5.0)
Alkaline Phosphatase: 62 U/L (ref 38–126)
Anion gap: 10 (ref 5–15)
BUN: 17 mg/dL (ref 6–20)
CO2: 21 mmol/L — ABNORMAL LOW (ref 22–32)
Calcium: 9 mg/dL (ref 8.9–10.3)
Chloride: 105 mmol/L (ref 101–111)
Creatinine, Ser: 0.77 mg/dL (ref 0.44–1.00)
GFR calc Af Amer: >60 mL/min (ref >60)
GFR calc non Af Amer: >60 mL/min (ref >60)
Glucose, Bld: 136 mg/dL — ABNORMAL HIGH (ref 65–99)
Potassium: 3.3 mmol/L — ABNORMAL LOW (ref 3.5–5.1)
Sodium: 136 mmol/L (ref 135–145)
Total Bilirubin: 0.6 mg/dL (ref 0.3–1.2)
Total Protein: 7.8 g/dL (ref 6.5–8.1)

## 2018-01-17 LAB — URINALYSIS, ROUTINE W REFLEX MICROSCOPIC
Glucose, UA: NEGATIVE mg/dL
KETONES UR: 15 mg/dL — AB
LEUKOCYTES UA: NEGATIVE
NITRITE: NEGATIVE
PROTEIN: NEGATIVE mg/dL
Specific Gravity, Urine: >1.03 — ABNORMAL HIGH (ref 1.005–1.030)
pH: 6 (ref 5.0–8.0)

## 2018-01-17 LAB — URINALYSIS, MICROSCOPIC (REFLEX)

## 2018-01-17 LAB — HCG, QUANTITATIVE, PREGNANCY: hCG, Beta Chain, Quant, S: 4095 m[IU]/mL — ABNORMAL HIGH (ref <5)

## 2018-01-17 LAB — WET PREP, GENITAL
Sperm: NONE SEEN
Trich, Wet Prep: NONE SEEN
Yeast Wet Prep HPF POC: NONE SEEN

## 2018-01-17 LAB — PREGNANCY, URINE: Preg Test, Ur: POSITIVE — AB

## 2018-01-17 MED ORDER — ACETAMINOPHEN 500 MG PO TABS
1000.0000 mg | ORAL_TABLET | Freq: Once | ORAL | Status: AC
  Administered 2018-01-17: 1000 mg via ORAL
  Filled 2018-01-17: qty 2

## 2018-01-17 MED ORDER — PRENATAL COMPLETE 14-0.4 MG PO TABS: 1.0000 | ORAL_TABLET | Freq: Once | ORAL | 0 refills | Status: AC

## 2018-01-17 NOTE — ED Triage Notes (Signed)
C/o vaginal bleeding x 2 days-had pos home preg test-no medical f/u-LNMP 11/24/17-NAD-steady gait

## 2018-01-17 NOTE — ED Provider Notes (Signed)
[facility] EMERGENCY DEPARTMENT Provider Note   CSN: [id number] Arrival date & time: 01/17/18  1920     History   Chief Complaint Chief Complaint  Patient presents with  . Vaginal Bleeding    HPI Kathleen Moss is a 22 y.o. female G3 P2 who presents with vaginal bleeding.  Patient had home positive pregnancy test and is thought to be 8-[redacted] weeks pregnant per the nurse who spoke to on the phone and her OB/GYN.  LMP 11/24/2017.  Patient reports and yesterday began having 2 days ago she began to have pink spotting bright red bleeding.  Today she began having large blood clots, one the size of her hand.  She has had associated lower abdominal cramping and low back pain.  She denies any fevers, chest pain, shortness of breath, nausea, vomiting, urinary symptoms.  Prior to onset, patient denies any abnormal vaginal bleeding.  HPI  Past Medical History:  Diagnosis Date  . Asthma   . Migraines     There are no active problems to display for this patient.   Past Surgical History:  Procedure Laterality Date  . WRIST SURGERY      OB History    No data available       Home Medications    Prior to Admission medications   Not on File    Family History No family history on file.  Social History Social History   Tobacco Use  . Smoking status: Never Smoker  . Smokeless tobacco: Never Used  Substance Use Topics  . Alcohol use: No  . Drug use: No     Allergies   Patient has no known allergies.   Review of Systems Review of Systems  Constitutional: Negative for chills and fever.  HENT: Negative for facial swelling and sore throat.   Respiratory: Negative for shortness of breath.   Cardiovascular: Negative for chest pain.  Gastrointestinal: Positive for abdominal pain. Negative for nausea and vomiting.  Genitourinary: Positive for vaginal bleeding. Negative for dysuria.  Musculoskeletal: Negative for back pain.  Skin: Negative for rash and wound.    Neurological: Negative for headaches.  Psychiatric/Behavioral: The patient is not nervous/anxious.      Physical Exam Updated Vital Signs BP 103/73   Pulse 71   Temp (!) 97.4 F (36.3 C) (Oral)   Resp 18   Ht 5' (1.524 m)   Wt 57.2 kg (126 lb)   LMP 11/24/2017   SpO2 99%   BMI 24.61 kg/m   Physical Exam  Constitutional: She appears well-developed and well-nourished. No distress.  HENT:  Head: Normocephalic and atraumatic.  Mouth/Throat: Oropharynx is clear and moist. No oropharyngeal exudate.  Eyes: Conjunctivae are normal. Pupils are equal, round, and reactive to light. Right eye exhibits no discharge. Left eye exhibits no discharge. No scleral icterus.  Neck: Normal range of motion. Neck supple. No thyromegaly present.  Cardiovascular: Normal rate, regular rhythm, normal heart sounds and intact distal pulses. Exam reveals no gallop and no friction rub.  No murmur heard. Pulmonary/Chest: Effort normal and breath sounds normal. No stridor. No respiratory distress. She has no wheezes. She has no rales.  Abdominal: Soft. Bowel sounds are normal. She exhibits no distension. There is tenderness in the right lower quadrant, suprapubic area and left lower quadrant. There is no rebound, no guarding and no CVA tenderness.  Musculoskeletal: She exhibits no edema.  Lymphadenopathy:    She has no cervical adenopathy.  Neurological: She is alert. Coordination normal.  Skin:  Skin is warm and dry. No rash noted. She is not diaphoretic. No pallor.  Psychiatric: She has a normal mood and affect.  Nursing note and vitals reviewed.    ED Treatments / Results  Labs (all labs ordered are listed, but only abnormal results are displayed) Labs Reviewed  WET PREP, GENITAL - Abnormal; Notable for the following components:      Result Value   Clue Cells Wet Prep HPF POC PRESENT (*)    WBC, Wet Prep HPF POC FEW (*)    All other components within normal limits  PREGNANCY, URINE - Abnormal;  Notable for the following components:   Preg Test, Ur POSITIVE (*)    All other components within normal limits  URINALYSIS, ROUTINE W REFLEX MICROSCOPIC - Abnormal; Notable for the following components:   Specific Gravity, Urine >1.030 (*)    Hgb urine dipstick LARGE (*)    Bilirubin Urine SMALL (*)    Ketones, ur 15 (*)    All other components within normal limits  HCG, QUANTITATIVE, PREGNANCY - Abnormal; Notable for the following components:   hCG, Beta Chain, Quant, S 4,095 (*)    All other components within normal limits  URINALYSIS, MICROSCOPIC (REFLEX) - Abnormal; Notable for the following components:   Bacteria, UA RARE (*)    Squamous Epithelial / LPF 0-5 (*)    All other components within normal limits  CBC WITH DIFFERENTIAL/PLATELET - Abnormal; Notable for the following components:   WBC 12.1 (*)    Hemoglobin 11.4 (*)    HCT 35.2 (*)    Neutro Abs 8.3 (*)    All other components within normal limits  COMPREHENSIVE METABOLIC PANEL - Abnormal; Notable for the following components:   Potassium 3.3 (*)    CO2 21 (*)    Glucose, Bld 136 (*)    ALT 10 (*)    All other components within normal limits  RPR  HIV ANTIBODY (ROUTINE TESTING)  ABO/RH  GC/CHLAMYDIA PROBE AMP ([place]) NOT AT [facility]    EKG  EKG Interpretation None       Radiology [place] Ob Comp < 14 Wks  Result Date: 01/17/2018 CLINICAL DATA:  Pelvic pain and vaginal bleeding for 2 days. EXAM: OBSTETRIC <14 WK US AND TRANSVAGINAL OB US TECHNIQUE: Both transabdominal and transvaginal ultrasound examinations were performed for complete evaluation of the gestation as well as the maternal uterus, adnexal regions, and pelvic cul-de-sac. Transvaginal technique was performed to assess early pregnancy. COMPARISON:  None. FINDINGS: Intrauterine gestational sac: Single gestational sac within the lower portion of the endometrial canal Yolk sac:  Present Embryo:  Not seen Cardiac Activity: Not seen MSD: 10 mm   5 w   5 d  CRL:    mm    w    d                  US EDC: Subchorionic hemorrhage:  None visualized. Maternal uterus/adnexae: Probable corpus luteum within the left ovary. No mass or free fluid in the adjacent left adnexal region. Right ovary appears normal and there is no mass or free fluid within the right adnexal region. IMPRESSION: 1. Gestational sac is abnormally low within the endometrial canal, suspected spontaneous abortion in progress. 2. Probable small corpus luteum within the left ovary. No mass or free fluid within either adnexal region. Electronically Signed   By: Bary RichardStan  Maynard M.D.   On: 01/17/2018 22:45   [place] Ob Transvaginal  Result Date: 01/17/2018 CLINICAL DATA:  Pelvic pain and vaginal bleeding for 2 days. EXAM: OBSTETRIC <14 WK [place] AND TRANSVAGINAL OB US TECHNIQUE: Both transabdominal and transvaginal ultrasound examinations were performed for complete evaluation of the gestation as well as the maternal uterus, adnexal regions, and pelvic cul-de-sac. Transvaginal technique was performed to assess early pregnancy. COMPARISON:  None. FINDINGS: Intrauterine gestational sac: Single gestational sac within the lower portion of the endometrial canal Yolk sac:  Present Embryo:  Not seen Cardiac Activity: Not seen MSD: 10 mm   5 w   5 d CRL:    mm    w    d                  [place] EDC: Subchorionic hemorrhage:  None visualized. Maternal uterus/adnexae: Probable corpus luteum within the left ovary. No mass or free fluid in the adjacent left adnexal region. Right ovary appears normal and there is no mass or free fluid within the right adnexal region. IMPRESSION: 1. Gestational sac is abnormally low within the endometrial canal, suspected spontaneous abortion in progress. 2. Probable small corpus luteum within the left ovary. No mass or free fluid within either adnexal region. Electronically Signed   By: Bary Richard M.D.   On: 01/17/2018 22:45    Procedures Procedures (including critical care time)  Medications  Ordered in ED Medications  acetaminophen (TYLENOL) tablet 1,000 mg (1,000 mg Oral Given 01/17/18 2147)     Initial Impression / Assessment and Plan / ED Course  I have reviewed the triage vital signs and the nursing notes.  Pertinent labs & imaging results that were available during my care of the patient were reviewed by me and considered in my medical decision making (see chart for details).     Patient with suspected spontaneous abortion in progress.  CBC shows WBC 12.1, hemoglobin 11.4.  CMP unremarkable except for mild hypokalemia, 3.3.  HCG quant [id number].  Wet prep shows clue cells, however patient would like to wait to be treated until after her symptoms resolved.  Patient is O+ and not a RhoGam candidate.  Ultrasound shows gestational sac abnormally low within the endometrial canal suspected spontaneous abortion in progress; probable small corpus luteum within the left ovary, no mass or free fluid within either adnexal region.  Urine negative for infection.  GC/chlamydia, HIV, RPR sent.  Patient given prenatal vitamins in case pregnancy is not lost.  Patient plans to follow-up with women's outpatient clinic tomorrow morning.  Return precautions discussed.  Patient understands and agrees with plan.  Patient vitals stable and discharged in satisfactory condition. I discussed patient case with Dr. Corlis Leak who guided the patient's management and agrees with plan.   Final Clinical Impressions(s) / ED Diagnoses   Final diagnoses:  Vaginal bleeding affecting early pregnancy    ED Discharge Orders        Ordered    Prenatal Vit-Fe Fumarate-FA (PRENATAL COMPLETE) 14-0.4 MG TABS   Once     01/17/18 2312       Emi Holes, PA-C 01/18/18 0031    Abelino Derrick, MD 01/24/18 1429

## 2018-01-17 NOTE — ED Notes (Signed)
ED Provider at bedside. 

## 2018-01-17 NOTE — Discharge Instructions (Signed)
In case of continued pregnancy, begin taking prenatal vitamin daily.  Please go to the women's outpatient clinic tomorrow for further evaluation and follow-up.  Please return to the emergency department or go to [facility] emergency department if you develop any new or worsening symptoms.

## 2018-01-18 LAB — ABO/RH: ABO/RH(D): O POS

## 2018-01-18 LAB — GC/CHLAMYDIA PROBE AMP (~~LOC~~) NOT AT ARMC
Chlamydia: POSITIVE — AB
Neisseria Gonorrhea: NEGATIVE

## 2018-01-19 LAB — RPR: RPR Ser Ql: NONREACTIVE

## 2018-01-19 LAB — HIV ANTIBODY (ROUTINE TESTING W REFLEX): HIV Screen 4th Generation wRfx: NONREACTIVE

## 2018-08-20 IMAGING — US US OB TRANSVAGINAL
1 series · 14 of 22 positions shown · non-contrast
Comparison: None.

CLINICAL DATA: Pelvic pain and vaginal bleeding for 2 days.

EXAM:
OBSTETRIC <14 WK US AND TRANSVAGINAL OB US
TECHNIQUE: Both transabdominal and transvaginal ultrasound examinations were
performed for complete evaluation of the gestation as well as the
maternal uterus, adnexal regions, and pelvic cul-de-sac.
Transvaginal technique was performed to assess early pregnancy.

[Series 1: us ob transvaginal · 0.15mm/px · 22 acquisitions, 14 frames shown]
[im 1/22]
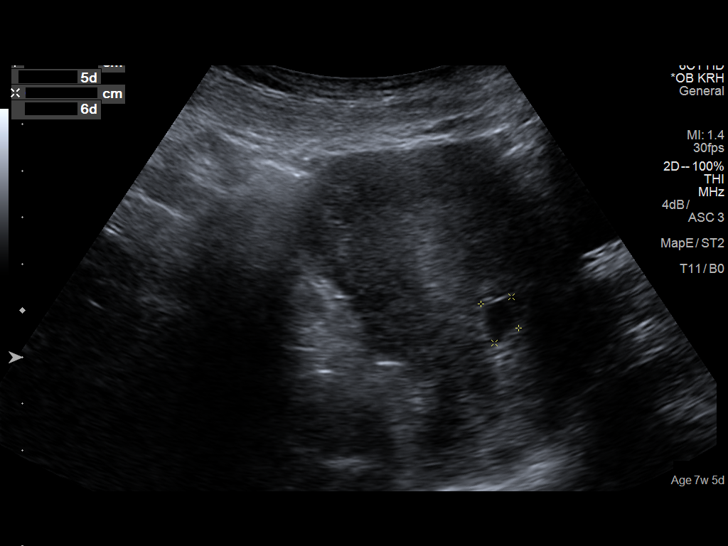
[im 3/22]
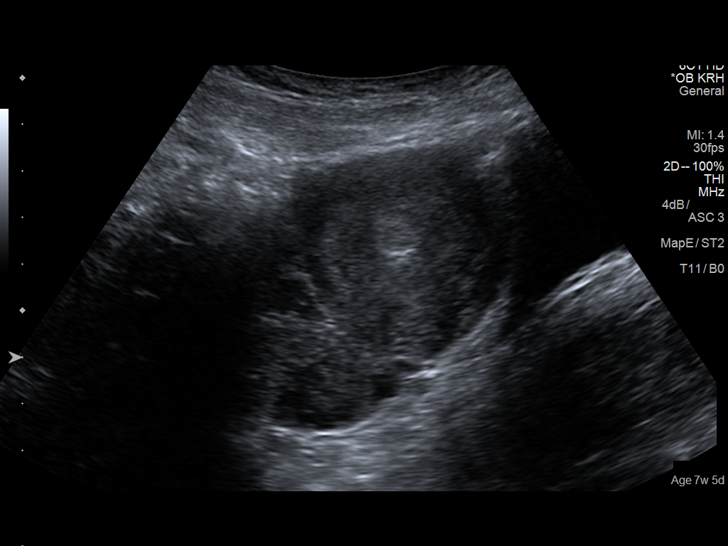
[im 4/22]
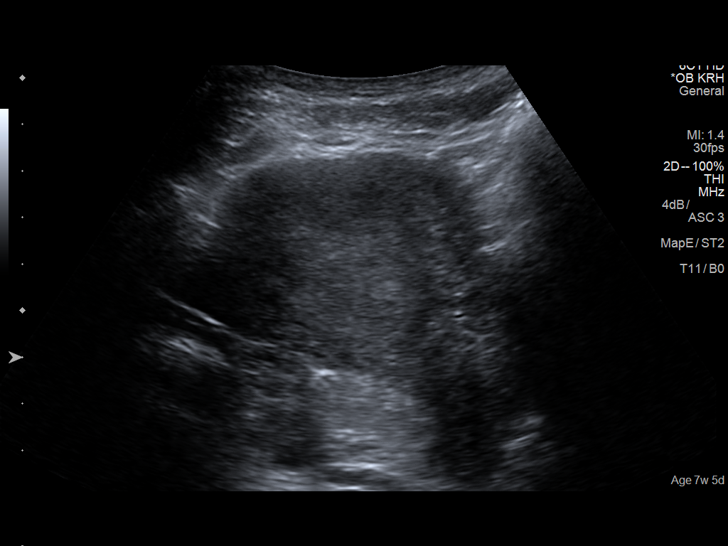
[im 6/22]
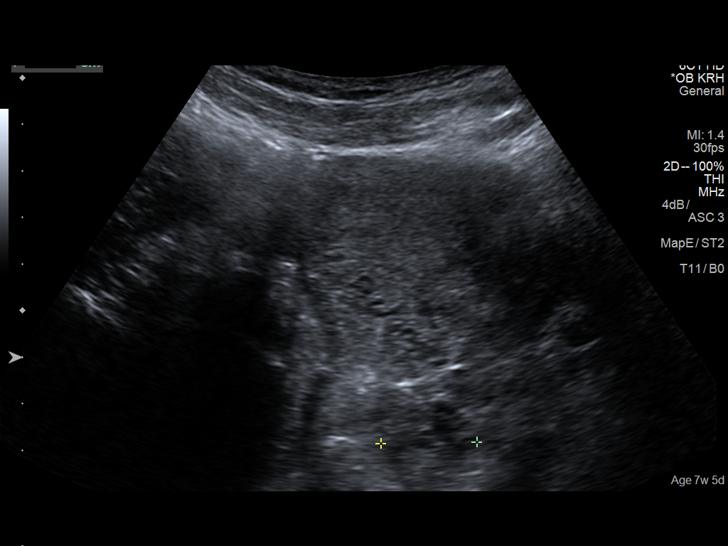
[im 8/22]
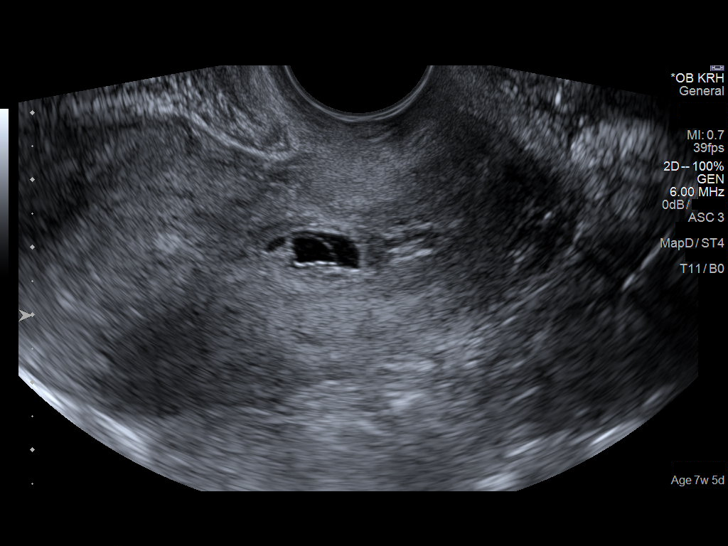
[im 9/22]
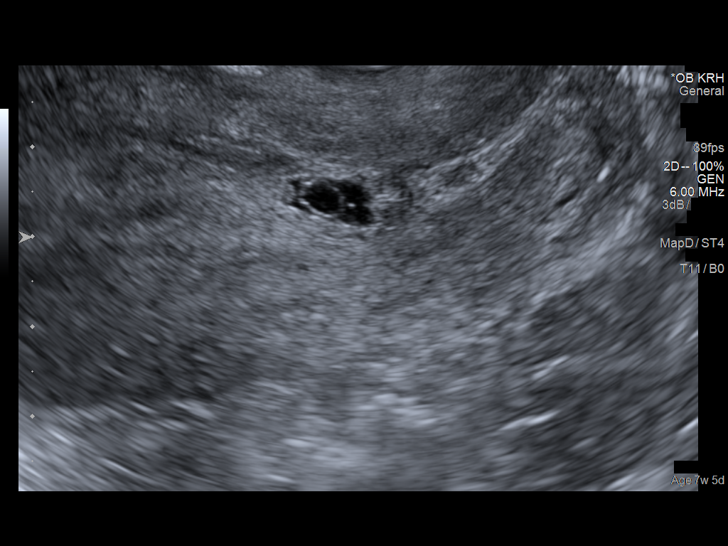
[im 11/22]
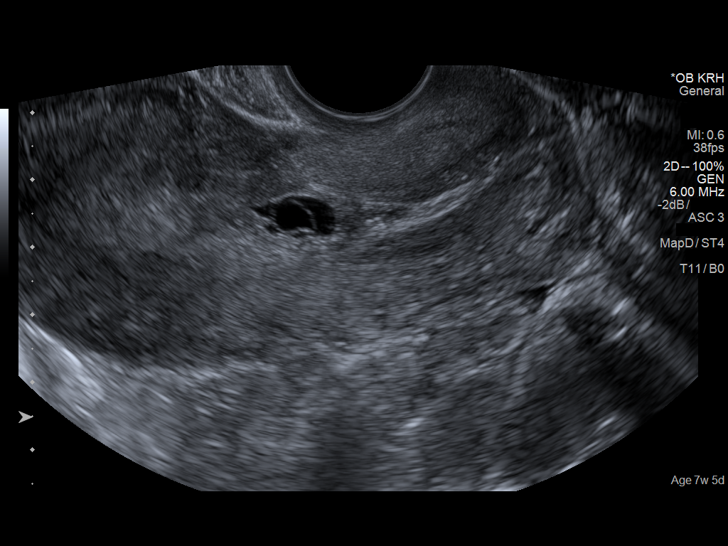
[im 12/22]
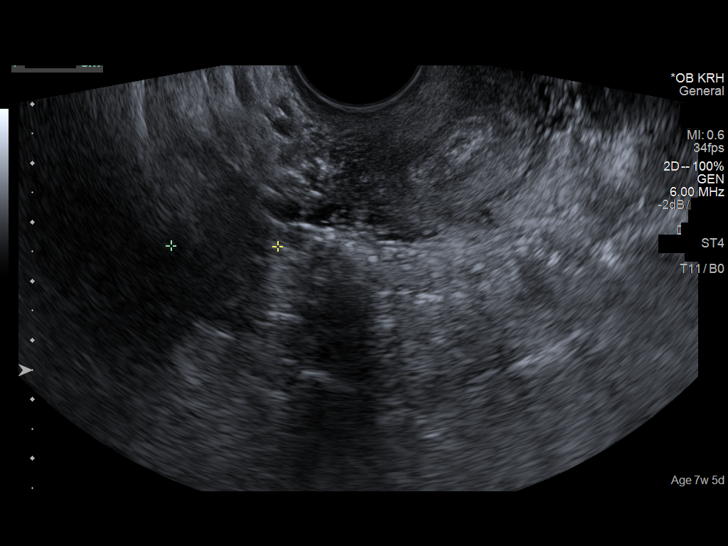
[im 14/22]
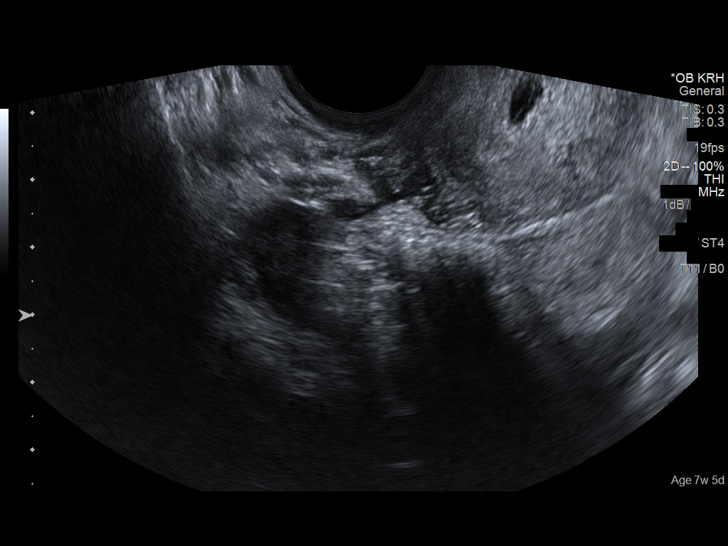
[im 15/22]
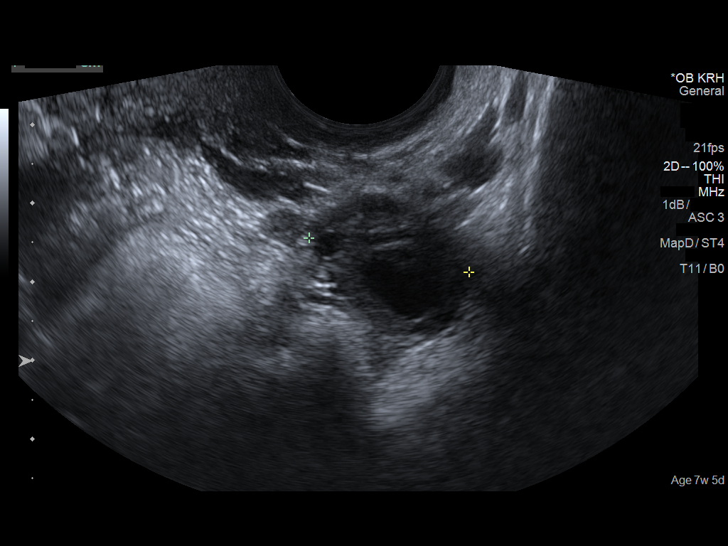
[im 17/22]
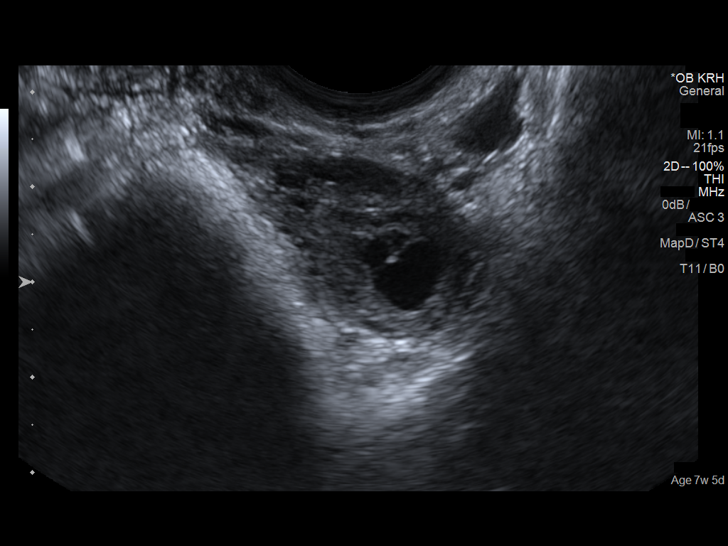
[im 19/22]
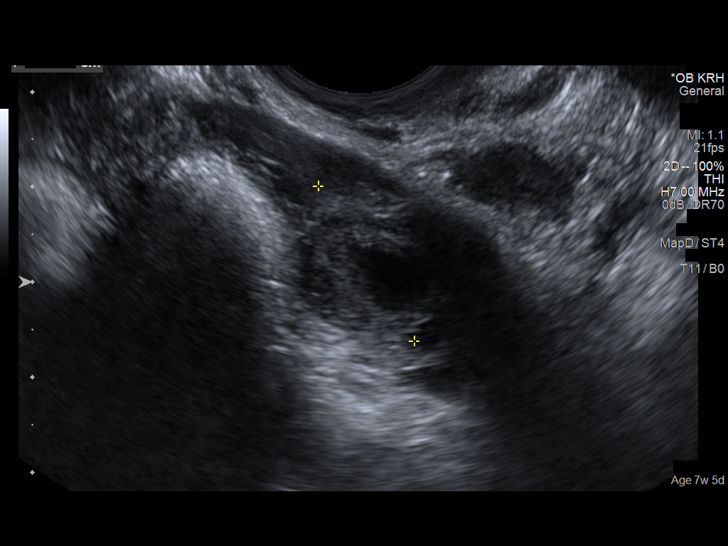
[im 20/22]
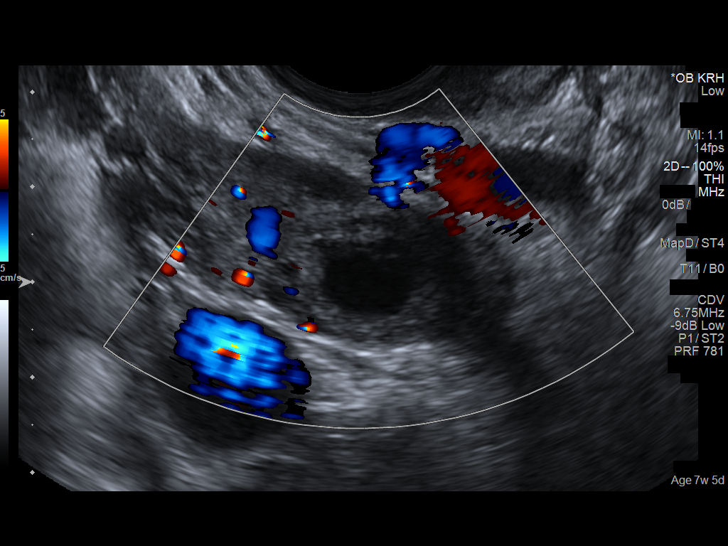
[im 22/22]
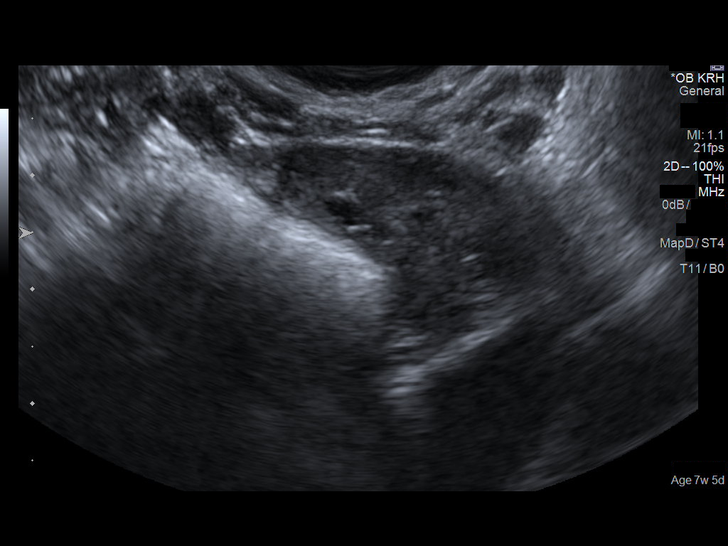

[14 of 22 positions shown; findings below may reference images not displayed]

FINDINGS: Intrauterine gestational sac: Single gestational sac within the
lower portion of the endometrial canal

Yolk sac:  Present

Embryo:  Not seen

Cardiac Activity: Not seen

MSD: 10 mm   5 w   5 d

CRL:    mm    w    d                  US EDC:

Subchorionic hemorrhage:  None visualized.

Maternal uterus/adnexae: Probable corpus luteum within the left
ovary. No mass or free fluid in the adjacent left adnexal region.
Right ovary appears normal and there is no mass or free fluid within
the right adnexal region.
IMPRESSION: 1. Gestational sac is abnormally low within the endometrial canal,
suspected spontaneous abortion in progress.
2. Probable small corpus luteum within the left ovary. No mass or
free fluid within either adnexal region.

## 2021-02-02 ENCOUNTER — Other Ambulatory Visit: Payer: Self-pay

## 2021-02-02 ENCOUNTER — Encounter (HOSPITAL_BASED_OUTPATIENT_CLINIC_OR_DEPARTMENT_OTHER): Payer: Self-pay

## 2021-02-02 ENCOUNTER — Emergency Department (HOSPITAL_BASED_OUTPATIENT_CLINIC_OR_DEPARTMENT_OTHER)
Admission: EM | Admit: 2021-02-02 | Discharge: 2021-02-02 | Disposition: A | Discharge status: Home / Self Care | Payer: Medicaid Other | Attending: Emergency Medicine | Admitting: Emergency Medicine

## 2021-02-02 DIAGNOSIS — M542 Cervicalgia: Secondary | ICD-10-CM | POA: Insufficient documentation

## 2021-02-02 DIAGNOSIS — J45909 Unspecified asthma, uncomplicated: Secondary | ICD-10-CM | POA: Diagnosis not present

## 2021-02-02 DIAGNOSIS — R519 Headache, unspecified: Secondary | ICD-10-CM | POA: Diagnosis not present

## 2021-02-02 DIAGNOSIS — H53149 Visual discomfort, unspecified: Secondary | ICD-10-CM | POA: Insufficient documentation

## 2021-02-02 HISTORY — DX: Unspecified convulsions: R56.9

## 2021-02-02 LAB — PREGNANCY, URINE: Preg Test, Ur: NEGATIVE

## 2021-02-02 MED ORDER — DIPHENHYDRAMINE HCL 25 MG PO CAPS
25.0000 mg | ORAL_CAPSULE | Freq: Once | ORAL | Status: AC
  Administered 2021-02-02: 25 mg via ORAL
  Filled 2021-02-02: qty 1

## 2021-02-02 MED ORDER — PROCHLORPERAZINE EDISYLATE 10 MG/2ML IJ SOLN
10.0000 mg | Freq: Once | INTRAMUSCULAR | Status: AC
  Administered 2021-02-02: 10 mg via INTRAMUSCULAR
  Filled 2021-02-02: qty 2

## 2021-02-02 MED ORDER — KETOROLAC TROMETHAMINE 60 MG/2ML IM SOLN
60.0000 mg | Freq: Once | INTRAMUSCULAR | Status: AC
  Administered 2021-02-02: 60 mg via INTRAMUSCULAR
  Filled 2021-02-02: qty 2

## 2021-02-02 NOTE — ED Provider Notes (Incomplete)
[facility] EMERGENCY DEPARTMENT Provider Note   CSN: [id number] Arrival date & time: 02/02/21  1139     History Chief Complaint  Patient presents with  . Neck Pain    Kathleen Moss is a 25 y.o. female presents with chief complaint of migraine headache and neck pain.  Patient reports that her migraine has been going on for past 5 days.  Pain is constant, pain was gradual in onset, pain has progressively worsened, patient reports this pain is similar to previous migraines that she has had.  Patient reports pain is improved with Tylenol.  No change in pain with exertion.  Patient endorses photophobia.  Patient reports that she developed neck pain over the past 2 days.  Pain is constant, rated 10/10 on the pain scale, worse with movement, no alleviating factors.  Patient denies any recent falls or injuries.  Patient reports tingling sensation in left arm.  No numbness to extremities, weakness to extremities saddle anesthesia, bowel or bladder dysfunction.    Patient denies any fevers, chills, nausea, vomiting, lightheadedness, dizziness, syncopal episodes, visual disturbance, back pain.    HPI     Past Medical History:  Diagnosis Date  . Asthma   . Migraines   . Seizures (HCC)     There are no problems to display for this patient.   Past Surgical History:  Procedure Laterality Date  . CESAREAN SECTION    . WRIST SURGERY       OB History   No obstetric history on file.     No family history on file.  Social History   Tobacco Use  . Smoking status: Never Smoker  . Smokeless tobacco: Never Used  Vaping Use  . Vaping Use: Never used  Substance Use Topics  . Alcohol use: Yes    Comment: occ  . Drug use: No    Home Medications Prior to Admission medications   Not on File    Allergies    Patient has no known allergies.  Review of Systems   Review of Systems  Constitutional: Negative for chills and fever.  Eyes: Negative for visual disturbance.   Respiratory: Negative for shortness of breath.   Cardiovascular: Negative for chest pain.  Gastrointestinal: Negative for abdominal pain, nausea and vomiting.  Genitourinary: Negative for difficulty urinating and dysuria.  Musculoskeletal: Positive for neck pain. Negative for back pain.  Skin: Negative for color change and rash.  Neurological: Positive for headaches. Negative for dizziness, tremors, seizures, syncope, facial asymmetry, speech difficulty, weakness, light-headedness and numbness.  Psychiatric/Behavioral: Negative for confusion.    Physical Exam Updated Vital Signs BP 118/62 (BP Location: Left Arm)   Pulse 70   Temp 98.6 F (37 C) (Oral)   Resp 20   Ht 4\' 11"  (1.499 m)   Wt 53.5 kg   LMP 12/17/2020   SpO2 100%   BMI 23.83 kg/m   Physical Exam Vitals and nursing note reviewed.  Constitutional:      General: She is not in acute distress.    Appearance: She is not ill-appearing, toxic-appearing or diaphoretic.  HENT:     Head: Normocephalic and atraumatic.     Mouth/Throat:     Pharynx: Oropharynx is clear. Uvula midline. No pharyngeal swelling, oropharyngeal exudate, posterior oropharyngeal erythema or uvula swelling.  Eyes:     General: No scleral icterus.       Right eye: No discharge.        Left eye: No discharge.  Extraocular Movements: Extraocular movements intact.     Pupils: Pupils are equal, round, and reactive to light.  Cardiovascular:     Rate and Rhythm: Normal rate.  Pulmonary:     Effort: Pulmonary effort is normal.  Abdominal:     Palpations: Abdomen is soft.     Tenderness: There is no abdominal tenderness.  Musculoskeletal:     Cervical back: Full passive range of motion without pain, normal range of motion and neck supple. Tenderness (Paraspinous) present. No swelling, edema, deformity, erythema, signs of trauma, lacerations, rigidity, torticollis, bony tenderness or crepitus. Muscular tenderness present. No pain with movement or  spinous process tenderness. Normal range of motion.     Thoracic back: No deformity, tenderness or bony tenderness.     Lumbar back: No deformity, tenderness or bony tenderness.  Skin:    General: Skin is warm and dry.  Neurological:     General: No focal deficit present.     Mental Status: She is alert.     GCS: GCS eye subscore is 4. GCS verbal subscore is 5. GCS motor subscore is 6.     Cranial Nerves: No cranial nerve deficit or facial asymmetry.     Sensory: Sensation is intact.     Motor: No weakness, tremor, seizure activity or pronator drift.     Coordination: Romberg sign negative. Finger-Nose-Finger Test normal.     Gait: Gait is intact. Gait normal.     Comments: CN II-XII intact, equal grip strength, +5 strength to bilateral upper and lower extremities    Psychiatric:        Behavior: Behavior is cooperative.     ED Results / Procedures / Treatments   Labs (all labs ordered are listed, but only abnormal results are displayed) Labs Reviewed  PREGNANCY, URINE    EKG EKG Interpretation  Date/Time:  Wednesday February 02 2021 11:43:00 EST Ventricular Rate:  77 PR Interval:  140 QRS Duration: 96 QT Interval:  360 QTC Calculation: 407 R Axis:   87 Text Interpretation: Normal sinus rhythm Low voltage QRS Borderline ECG No old tracing to compare Confirmed by Meridee Score [phone]) on 02/02/2021 12:15:13 PM   Radiology No results found.  Procedures Procedures   Medications Ordered in ED Medications  diphenhydrAMINE (BENADRYL) capsule 25 mg (25 mg Oral Given 02/02/21 1415)  prochlorperazine (COMPAZINE) injection 10 mg (10 mg Intramuscular Given 02/02/21 1419)  ketorolac (TORADOL) injection 60 mg (60 mg Intramuscular Given 02/02/21 1416)    ED Course  I have reviewed the triage vital signs and the nursing notes.  Pertinent labs & imaging results that were available during my care of the patient were reviewed by me and considered in my medical decision making  (see chart for details).    MDM Rules/Calculators/A&P                          Alert 25 year old female in no acute distress, nontoxic appearing.  Patient appears uncomfortable.  Patient reports chief complaint of migraine headache and neck pain.  Migraine has been present for the past 5 days.  Neck pain began 2 days prior.  Patient has history of migraines.  Patient's headache was gradual in onset pain was not maximal at onset, patient compares this headache to previous migraine she has had, no increase in pain with exertion.  Less concerning for subarachnoid hemorrhage.  Patient denies any recent falls or traumatic injuries.  Patient denies any numbness or weakness to  the extremities, saddle anesthesia, bowel or bladder dysfunction.  Patient does report tingling sensation in the left arm.  No focal neurological deficits are noted on physical exam.  Patient has sensation intact to soft touch and bilateral upper and lower extremities.  Patient's neck pain is paraspinous.  No tenderness to spinous process, deformity or step-off noted.  Likely that patient's neck pain is related to her untreated migraine.  Will give patient Compazine, Toradol, and Benadryl.  On reassessment patient continues to have no focal neurological deficits noted.  Patient improve    Reported improvement in her symptoms after receiving medication.  Final Clinical Impression(s) / ED Diagnoses Final diagnoses:  None    Rx / DC Orders ED Discharge Orders    None

## 2021-02-02 NOTE — Discharge Instructions (Addendum)
You came to the emergency department to be evaluated for your headache and neck pain.  Your physical exam was reassuring.  Your pain improved with medication.    Get help right away if: Your migraine becomes severe and medicine does not help. You have a stiff neck and fever. You have a loss of vision. You have muscle weakness or loss of muscle control. You start losing your balance, or you have trouble walking. You feel like you may faint, or you faint. You start having sudden and unexpected, severe headaches. You have a seizure.

## 2021-02-02 NOTE — ED Triage Notes (Signed)
Pt c/o posterior neck pain with numbness to left arm x 2 days-denies injury-NAD-steady gait-last dose tylenol last night

## 2021-02-02 NOTE — ED Provider Notes (Signed)
[facility] EMERGENCY DEPARTMENT Provider Note   CSN: [id number] Arrival date & time: 02/02/21  1139     History Chief Complaint  Patient presents with  . Neck Pain    Kathleen Moss is a 25 y.o. female presents with chief complaint of migraine headache and neck pain.  Patient reports that her migraine has been going on for past 5 days.  Pain is constant, pain was gradual in onset, pain has progressively worsened, patient reports this pain is similar to previous migraines that she has had.  Patient reports pain is improved with Tylenol.  No change in pain with exertion.  Patient endorses photophobia.  Patient reports that she developed neck pain over the past 2 days.  Pain is constant, rated 10/10 on the pain scale, worse with movement, no alleviating factors.  Patient denies any recent falls or injuries.  Patient reports tingling sensation in left arm.  No numbness to extremities, weakness to extremities saddle anesthesia, bowel or bladder dysfunction.    Patient denies any fevers, chills, nausea, vomiting, lightheadedness, dizziness, syncopal episodes, visual disturbance, back pain.    HPI     Past Medical History:  Diagnosis Date  . Asthma   . Migraines   . Seizures (HCC)     There are no problems to display for this patient.   Past Surgical History:  Procedure Laterality Date  . CESAREAN SECTION    . WRIST SURGERY       OB History   No obstetric history on file.     No family history on file.  Social History   Tobacco Use  . Smoking status: Never Smoker  . Smokeless tobacco: Never Used  Vaping Use  . Vaping Use: Never used  Substance Use Topics  . Alcohol use: Yes    Comment: occ  . Drug use: No    Home Medications Prior to Admission medications   Not on File    Allergies    Patient has no known allergies.  Review of Systems   Review of Systems  Constitutional: Negative for chills and fever.  Eyes: Negative for visual disturbance.   Respiratory: Negative for shortness of breath.   Cardiovascular: Negative for chest pain.  Gastrointestinal: Negative for abdominal pain, nausea and vomiting.  Genitourinary: Negative for difficulty urinating and dysuria.  Musculoskeletal: Positive for neck pain. Negative for back pain.  Skin: Negative for color change and rash.  Neurological: Positive for headaches. Negative for dizziness, tremors, seizures, syncope, facial asymmetry, speech difficulty, weakness, light-headedness and numbness.  Psychiatric/Behavioral: Negative for confusion.    Physical Exam Updated Vital Signs BP 118/62 (BP Location: Left Arm)   Pulse 70   Temp 98.6 F (37 C) (Oral)   Resp 20   Ht 4\' 11"  (1.499 m)   Wt 53.5 kg   LMP 12/17/2020   SpO2 100%   BMI 23.83 kg/m   Physical Exam Vitals and nursing note reviewed.  Constitutional:      General: She is not in acute distress.    Appearance: She is not ill-appearing, toxic-appearing or diaphoretic.  HENT:     Head: Normocephalic and atraumatic.     Mouth/Throat:     Pharynx: Oropharynx is clear. Uvula midline. No pharyngeal swelling, oropharyngeal exudate, posterior oropharyngeal erythema or uvula swelling.  Eyes:     General: No scleral icterus.       Right eye: No discharge.        Left eye: No discharge.  Extraocular Movements: Extraocular movements intact.     Pupils: Pupils are equal, round, and reactive to light.  Cardiovascular:     Rate and Rhythm: Normal rate.  Pulmonary:     Effort: Pulmonary effort is normal.  Abdominal:     Palpations: Abdomen is soft.     Tenderness: There is no abdominal tenderness.  Musculoskeletal:     Cervical back: Full passive range of motion without pain, normal range of motion and neck supple. Tenderness (Paraspinous) present. No swelling, edema, deformity, erythema, signs of trauma, lacerations, rigidity, torticollis, bony tenderness or crepitus. Muscular tenderness present. No pain with movement or  spinous process tenderness. Normal range of motion.     Thoracic back: No deformity, tenderness or bony tenderness.     Lumbar back: No deformity, tenderness or bony tenderness.  Skin:    General: Skin is warm and dry.  Neurological:     General: No focal deficit present.     Mental Status: She is alert.     GCS: GCS eye subscore is 4. GCS verbal subscore is 5. GCS motor subscore is 6.     Cranial Nerves: No cranial nerve deficit or facial asymmetry.     Sensory: Sensation is intact.     Motor: No weakness, tremor, seizure activity or pronator drift.     Coordination: Romberg sign negative. Finger-Nose-Finger Test normal.     Gait: Gait is intact. Gait normal.     Comments: CN II-XII intact, equal grip strength, +5 strength to bilateral upper and lower extremities    Psychiatric:        Behavior: Behavior is cooperative.     ED Results / Procedures / Treatments   Labs (all labs ordered are listed, but only abnormal results are displayed) Labs Reviewed  PREGNANCY, URINE    EKG EKG Interpretation  Date/Time:  Wednesday February 02 2021 11:43:00 EST Ventricular Rate:  77 PR Interval:  140 QRS Duration: 96 QT Interval:  360 QTC Calculation: 407 R Axis:   87 Text Interpretation: Normal sinus rhythm Low voltage QRS Borderline ECG No old tracing to compare Confirmed by Meridee Score [phone]) on 02/02/2021 12:15:13 PM   Radiology No results found.  Procedures Procedures   Medications Ordered in ED Medications  diphenhydrAMINE (BENADRYL) capsule 25 mg (25 mg Oral Given 02/02/21 1415)  prochlorperazine (COMPAZINE) injection 10 mg (10 mg Intramuscular Given 02/02/21 1419)  ketorolac (TORADOL) injection 60 mg (60 mg Intramuscular Given 02/02/21 1416)    ED Course  I have reviewed the triage vital signs and the nursing notes.  Pertinent labs & imaging results that were available during my care of the patient were reviewed by me and considered in my medical decision making  (see chart for details).    MDM Rules/Calculators/A&P                          Alert 25 year old female in no acute distress, nontoxic appearing.  Patient appears uncomfortable.  Patient reports chief complaint of migraine headache and neck pain.  Migraine has been present for the past 5 days.  Neck pain began 2 days prior.  Patient has history of migraines.  Patient's headache was gradual in onset pain was not maximal at onset, patient compares this headache to previous migraine she has had, no increase in pain with exertion.  Less concerning for subarachnoid hemorrhage.  Patient denies any recent falls or traumatic injuries.  Patient denies any numbness or weakness to  the extremities, saddle anesthesia, bowel or bladder dysfunction.  Patient does report tingling sensation in the left arm.  No focal neurological deficits are noted on physical exam.  Patient has sensation intact to soft touch and bilateral upper and lower extremities.  Patient's neck pain is paraspinous.  No tenderness to spinous process, deformity or step-off noted.  Likely that patient's neck pain is related to her untreated migraine.  Will give patient Compazine, Toradol, and Benadryl.  On reassessment patient continues to have no focal neurological deficits noted. Reported improvement in her symptoms after receiving medication.  Advised that medication she received today can cause sedation and that she should avoid driving or operating heavy machinery for the next 24 hours.  Patient sister at bedside and will drive patient home.  Patient to follow-up with her primary care provider if symptoms do not improve or she has migraines in the future.  Discussed results, findings, treatment and follow up. Patient advised of return precautions. Patient verbalized understanding and agreed with plan.   Final Clinical Impression(s) / ED Diagnoses Final diagnoses:  Neck pain  Acute nonintractable headache, unspecified headache type     Rx / DC Orders ED Discharge Orders    None       Haskel Schroeder, PA-C 02/03/21 0006    Terrilee Files, MD 02/03/21 [phone]

## 2021-09-29 ENCOUNTER — Other Ambulatory Visit: Payer: Self-pay

## 2021-09-29 ENCOUNTER — Encounter (HOSPITAL_BASED_OUTPATIENT_CLINIC_OR_DEPARTMENT_OTHER): Payer: Self-pay

## 2021-09-29 ENCOUNTER — Emergency Department (HOSPITAL_BASED_OUTPATIENT_CLINIC_OR_DEPARTMENT_OTHER)
Admission: EM | Admit: 2021-09-29 | Discharge: 2021-09-29 | Disposition: A | Discharge status: Home / Self Care | Payer: Medicaid Other | Attending: Emergency Medicine | Admitting: Emergency Medicine

## 2021-09-29 DIAGNOSIS — G43909 Migraine, unspecified, not intractable, without status migrainosus: Secondary | ICD-10-CM | POA: Diagnosis not present

## 2021-09-29 DIAGNOSIS — R202 Paresthesia of skin: Secondary | ICD-10-CM | POA: Insufficient documentation

## 2021-09-29 DIAGNOSIS — R42 Dizziness and giddiness: Secondary | ICD-10-CM | POA: Diagnosis not present

## 2021-09-29 DIAGNOSIS — J45909 Unspecified asthma, uncomplicated: Secondary | ICD-10-CM | POA: Diagnosis not present

## 2021-09-29 DIAGNOSIS — R519 Headache, unspecified: Secondary | ICD-10-CM | POA: Diagnosis present

## 2021-09-29 MED ORDER — SODIUM CHLORIDE 0.9 % IV BOLUS
500.0000 mL | Freq: Once | INTRAVENOUS | Status: AC
  Administered 2021-09-29: 500 mL via INTRAVENOUS

## 2021-09-29 MED ORDER — DEXAMETHASONE SODIUM PHOSPHATE 10 MG/ML IJ SOLN
10.0000 mg | Freq: Once | INTRAMUSCULAR | Status: AC
  Administered 2021-09-29: 10 mg via INTRAVENOUS
  Filled 2021-09-29: qty 1

## 2021-09-29 MED ORDER — METOCLOPRAMIDE HCL 5 MG/ML IJ SOLN
10.0000 mg | Freq: Once | INTRAMUSCULAR | Status: AC
  Administered 2021-09-29: 10 mg via INTRAVENOUS
  Filled 2021-09-29: qty 2

## 2021-09-29 MED ORDER — KETOROLAC TROMETHAMINE 30 MG/ML IJ SOLN
30.0000 mg | Freq: Once | INTRAMUSCULAR | Status: AC
  Administered 2021-09-29: 30 mg via INTRAVENOUS
  Filled 2021-09-29: qty 1

## 2021-09-29 NOTE — ED Notes (Signed)
ED Provider at bedside. 

## 2021-09-29 NOTE — Discharge Instructions (Signed)
You were seen in the emergency department for evaluation of a migraine headache.  You received a migraine cocktail here with improvement in your symptoms.  Please go home and rest.  Follow-up with your neurologist.  Return to the emergency department if any worsening or concerning symptoms.

## 2021-09-29 NOTE — ED Notes (Signed)
Pt requesting warm blankets, something to drink and a snack.  All provided.

## 2021-09-29 NOTE — ED Triage Notes (Signed)
Pt arrives with c/o migraine X4 days states that she has been  taking her migraine medication with last does being Tuesday night. C/O dizziness, seeing dots, and right sided numbness starting when waking yesterday.

## 2021-09-29 NOTE — ED Provider Notes (Signed)
[facility] EMERGENCY DEPARTMENT Provider Note   CSN: [id number] Arrival date & time: 09/29/21  9628     History Chief Complaint  Patient presents with   Migraine    Kathleen Moss is a 25 y.o. female.  She has a history of migraine headaches.  Complaining of a migraine headache for the last 4 days.  Associated with nausea 1 episode of vomiting, dizziness, and some decrease sensation on the right side of her body.  She tried her regular migraine medication without any relief.  She went to [facility] last night but left after waiting for 6 hours.  No fevers or chills.  No head trauma.  The history is provided by the patient.  Migraine This is a recurrent problem. The current episode started more than 2 days ago. The problem occurs constantly. The problem has not changed since onset.Associated symptoms include headaches. Pertinent negatives include no chest pain, no abdominal pain and no shortness of breath. Exacerbated by: Light and loud noises. Nothing relieves the symptoms. She has tried rest for the symptoms. The treatment provided no relief.      Past Medical History:  Diagnosis Date   Asthma    Migraines    Seizures (HCC)     There are no problems to display for this patient.   Past Surgical History:  Procedure Laterality Date   CESAREAN SECTION     WRIST SURGERY       OB History   No obstetric history on file.     No family history on file.  Social History   Tobacco Use   Smoking status: Never   Smokeless tobacco: Never  Vaping Use   Vaping Use: Never used  Substance Use Topics   Alcohol use: Yes    Comment: occ   Drug use: No    Home Medications Prior to Admission medications   Medication Sig Start Date End Date Taking? Authorizing Provider  nortriptyline (PAMELOR) 10 MG capsule Take by mouth. 09/12/21  Yes [provider]  rizatriptan (MAXALT) 5 MG tablet Take by mouth. 09/12/21  Yes [provider]     Allergies    Patient has no known allergies.  Review of Systems   Review of Systems  Constitutional:  Negative for fever.  HENT:  Negative for sore throat.   Eyes:  Negative for pain.  Respiratory:  Negative for shortness of breath.   Cardiovascular:  Negative for chest pain.  Gastrointestinal:  Positive for nausea and vomiting. Negative for abdominal pain.  Genitourinary:  Negative for dysuria.  Musculoskeletal:  Negative for neck pain.  Skin:  Negative for rash.  Neurological:  Positive for numbness and headaches. Negative for weakness.   Physical Exam Updated Vital Signs BP (!) 135/96 (BP Location: Right Arm)   Pulse 96   Temp 97.8 F (36.6 C) (Oral)   Resp 16   Ht 4\' 11"  (1.499 m)   Wt 55.8 kg   LMP 09/29/2021   SpO2 97%   BMI 24.84 kg/m   Physical Exam Vitals and nursing note reviewed.  Constitutional:      General: She is not in acute distress.    Appearance: Normal appearance. She is well-developed.  HENT:     Head: Normocephalic and atraumatic.  Eyes:     Conjunctiva/sclera: Conjunctivae normal.  Cardiovascular:     Rate and Rhythm: Normal rate and regular rhythm.     Heart sounds: No murmur heard. Pulmonary:     Effort: Pulmonary  effort is normal. No respiratory distress.     Breath sounds: Normal breath sounds.  Abdominal:     Palpations: Abdomen is soft.     Tenderness: There is no abdominal tenderness.  Musculoskeletal:     Cervical back: Neck supple.  Skin:    General: Skin is warm and dry.  Neurological:     Mental Status: She is alert.     Cranial Nerves: No cranial nerve deficit.     Sensory: Sensory deficit (Subjective decrease sensation right arm and right leg.) present.     Motor: No weakness.     Gait: Gait normal.    ED Results / Procedures / Treatments   Labs (all labs ordered are listed, but only abnormal results are displayed) Labs Reviewed - No data to display  EKG None  Radiology No results  found.  Procedures Procedures   Medications Ordered in ED Medications  ketorolac (TORADOL) 30 MG/ML injection 30 mg (has no administration in time range)  sodium chloride 0.9 % bolus 500 mL (has no administration in time range)  metoCLOPramide (REGLAN) injection 10 mg (has no administration in time range)  dexamethasone (DECADRON) injection 10 mg (has no administration in time range)    ED Course  I have reviewed the triage vital signs and the nursing notes.  Pertinent labs & imaging results that were available during my care of the patient were reviewed by me and considered in my medical decision making (see chart for details).  Clinical Course as of 09/29/21 1732  Thu Sep 29, 2021  1113 Patient feels better after migraine cocktail.  She is comfortable plan for discharge.  She has a ride.  Will give note for work. [MB]    Clinical Course User Index [MB] Terrilee Files, MD   MDM Rules/Calculators/A&P                          This patient complains of typical migraine headache with associated tingling on the right side of her arm and leg; this involves an extensive number of treatment Options and is a complaint that carries with it a high risk of complications and Morbidity. The differential includes migraine, complex migraine, less likely stroke, bleed.  She gets frequent headaches and follows with neurology for this. I ordered medication IV fluids steroids Reglan and Toradol with improvement in her symptoms   After the interventions stated above, I reevaluated the patient and found patient's headache and tingling to be improved.  She is comfortable plan for discharge and follow-up with her neurologist.  Return instructions discussed   Final Clinical Impression(s) / ED Diagnoses Final diagnoses:  Migraine without status migrainosus, not intractable, unspecified migraine type    Rx / DC Orders ED Discharge Orders     None        Terrilee Files, MD 09/29/21  1734

## 2022-04-07 ENCOUNTER — Emergency Department (HOSPITAL_BASED_OUTPATIENT_CLINIC_OR_DEPARTMENT_OTHER)
Admission: EM | Admit: 2022-04-07 | Discharge: 2022-04-07 | Disposition: A | Discharge status: Home / Self Care | Payer: Medicaid Other | Attending: Emergency Medicine | Admitting: Emergency Medicine

## 2022-04-07 ENCOUNTER — Other Ambulatory Visit: Payer: Self-pay

## 2022-04-07 ENCOUNTER — Encounter (HOSPITAL_BASED_OUTPATIENT_CLINIC_OR_DEPARTMENT_OTHER): Payer: Self-pay

## 2022-04-07 DIAGNOSIS — H9201 Otalgia, right ear: Secondary | ICD-10-CM | POA: Diagnosis not present

## 2022-04-07 DIAGNOSIS — J020 Streptococcal pharyngitis: Secondary | ICD-10-CM | POA: Diagnosis not present

## 2022-04-07 DIAGNOSIS — Z20822 Contact with and (suspected) exposure to covid-19: Secondary | ICD-10-CM | POA: Diagnosis not present

## 2022-04-07 DIAGNOSIS — R591 Generalized enlarged lymph nodes: Secondary | ICD-10-CM | POA: Insufficient documentation

## 2022-04-07 DIAGNOSIS — R197 Diarrhea, unspecified: Secondary | ICD-10-CM | POA: Diagnosis not present

## 2022-04-07 DIAGNOSIS — J029 Acute pharyngitis, unspecified: Secondary | ICD-10-CM | POA: Diagnosis present

## 2022-04-07 LAB — RESP PANEL BY RT-PCR (FLU A&B, COVID) ARPGX2
Influenza A by PCR: NEGATIVE
Influenza B by PCR: NEGATIVE
SARS Coronavirus 2 by RT PCR: NEGATIVE

## 2022-04-07 LAB — GROUP A STREP BY PCR: Group A Strep by PCR: DETECTED — AB

## 2022-04-07 MED ORDER — PENICILLIN G BENZATHINE 1200000 UNIT/2ML IM SUSY
1.2000 10*6.[IU] | PREFILLED_SYRINGE | Freq: Once | INTRAMUSCULAR | Status: AC
  Administered 2022-04-07: 1.2 10*6.[IU] via INTRAMUSCULAR
  Filled 2022-04-07: qty 2

## 2022-04-07 NOTE — Discharge Instructions (Addendum)
You have tested POSITIVE for strep throat today. Your COVID and flu testing were negative. ? ?We have treated you with a one time dose of antibiotic to cover for strep throat. You do not need any additional antibiotics.  ? ?Continue taking OTC Medications including Ibuprofen and Tylenol as needed for pain. Drink plenty of fluids to stay hydrated and eat softer foods over the next few days.  ? ?Follow up with your PCP for further evaluation. ? ?Return to the ED for any new/worsening symptoms including worsening pain, inability to swallow, drooling on yourself, muffled voice, or any other new/concerning symptoms.  ?

## 2022-04-07 NOTE — ED Provider Notes (Signed)
?[facility] ?Provider Note ? ? ?CSN: [id number] ?Arrival date & time: 04/07/22  [id number] ? ?  ? ?History ? ?Chief Complaint  ?Patient presents with  ? Sore Throat  ? ? ?[facility] is a 26 y.o. female who presents to the ED today with complaint of gradual onset, constant, achy, sore throat that began 2 days ago. Pt also complains of subjective fevers, chills, body aches, headache, congestion, R ear pain, and mild diarrhea. She does report she children were sick last week however tested negative for COVID, flu, and RSV. Pt has been taking OTC medications without much relief. Denies inability to swallow, drooling, voice change.  ? ?The history is provided by the patient and medical records.  ? ?  ? ?Home Medications ?Prior to Admission medications   ?Medication Sig Start Date End Date Taking? Authorizing Provider  ?nortriptyline (PAMELOR) 10 MG capsule Take by mouth. 09/12/21   [provider]  ?rizatriptan (MAXALT) 5 MG tablet Take by mouth. 09/12/21   [provider]  ?   ? ?Allergies    ?Patient has no known allergies.   ? ?Review of Systems   ?Review of Systems  ?Constitutional:  Positive for chills, fatigue and fever (subjective).  ?HENT:  Positive for congestion, ear pain and sore throat. Negative for facial swelling, trouble swallowing and voice change.   ?Respiratory:  Negative for cough.   ?Gastrointestinal:  Positive for diarrhea. Negative for abdominal pain and vomiting.  ?Musculoskeletal:  Positive for myalgias.  ?Neurological:  Positive for headaches.  ?All other systems reviewed and are negative. ? ?Physical Exam ?Updated Vital Signs ?BP 121/76 (BP Location: Right Arm)   Pulse 96   Temp 98.4 ?F (36.9 ?C) (Oral)   Resp 16   Ht 4\' 11"  (1.499 m)   Wt 55.8 kg   SpO2 98%   BMI 24.85 kg/m?  ?Physical Exam ?Vitals and nursing note reviewed.  ?Constitutional:   ?   Appearance: She is not ill-appearing or diaphoretic.  ?HENT:  ?   Head: Normocephalic and  atraumatic.  ?   Right Ear: Tympanic membrane normal.  ?   Left Ear: Tympanic membrane normal.  ?   Mouth/Throat:  ?   Mouth: Mucous membranes are moist.  ?   Pharynx: Uvula midline. Pharyngeal swelling and posterior oropharyngeal erythema present. No oropharyngeal exudate.  ?Eyes:  ?   Conjunctiva/sclera: Conjunctivae normal.  ?Neck:  ?   Comments: + bilateral anterior cervical lymphadenopathy ?Cardiovascular:  ?   Rate and Rhythm: Normal rate and regular rhythm.  ?   Heart sounds: Normal heart sounds.  ?Pulmonary:  ?   Effort: Pulmonary effort is normal.  ?   Breath sounds: Normal breath sounds. No wheezing, rhonchi or rales.  ?Lymphadenopathy:  ?   Cervical: Cervical adenopathy present.  ?Skin: ?   General: Skin is warm and dry.  ?   Coloration: Skin is not jaundiced.  ?Neurological:  ?   Mental Status: She is alert.  ? ? ?ED Results / Procedures / Treatments   ?Labs ?(all labs ordered are listed, but only abnormal results are displayed) ?Labs Reviewed  ?GROUP A STREP BY PCR - Abnormal; Notable for the following components:  ?    Result Value  ? Group A Strep by PCR DETECTED (*)   ? All other components within normal limits  ?RESP PANEL BY RT-PCR (FLU A&B, COVID) ARPGX2  ? ? ?EKG ?None ? ?Radiology ?No results found. ? ?Procedures ?Procedures  ? ? ?  Medications Ordered in ED ?Medications  ?penicillin g benzathine (BICILLIN LA) 1200000 UNIT/2ML injection 1.2 Million Units (1.2 Million Units Intramuscular Given 04/07/22 1013)  ? ? ?ED Course/ Medical Decision Making/ A&P ?Clinical Course as of 04/07/22 1029  ?Fri Apr 07, 2022  ?0958 Group A Strep by PCR(!): DETECTED [MV]  ?  ?Clinical Course User Index ?[MV] Eustaquio Maize, PA-C  ? ?                        ?Medical Decision Making ?26 year old female who presents to the ED today with URI-like symptoms that began 2 days ago.  On arrival to the ED today patient is afebrile, nontachycardic and nontachypneic.  She appears to be no acute distress.  She is noted to have  mild posterior oropharyngeal erythema and edema on exam.  Uvula is midline.  No muffled voice.  No signs of PTA.  Strep test has been collected prior to being seen, will await results.  Remainder of physical exam is unremarkable.  She does complain of headache, body aches, diarrhea, congestion.  We will plan to swab for COVID and flu as well for further evaluation.  If negative suspect viral illness.  ? ?Strep positive. Discussed abx options with patient - she would like 1 time Pen G IM. Will provide in the ED today. Remainder of work up negative. Do not feel pt requires additional labs or imaging at this time. Pt instructed on Ibuprofen/Tylenol PRN for pain and PCP follow up. Pt stable for discharge home at this time.  ? ?Problems Addressed: ?Strep pharyngitis: acute illness or injury ? ?Amount and/or Complexity of Data Reviewed ?Labs:  Decision-making details documented in ED Course. ? ?Risk ?Prescription drug management. ? ? ? ? ? ? ? ? ? ?Final Clinical Impression(s) / ED Diagnoses ?Final diagnoses:  ?Strep pharyngitis  ? ? ?Rx / DC Orders ?ED Discharge Orders   ? ? None  ? ?  ? ? ? ?Discharge Instructions   ? ?  ?You have tested POSITIVE for strep throat today. Your COVID and flu testing were negative. ? ?We have treated you with a one time dose of antibiotic to cover for strep throat. You do not need any additional antibiotics.  ? ?Continue taking OTC Medications including Ibuprofen and Tylenol as needed for pain. Drink plenty of fluids to stay hydrated and eat softer foods over the next few days.  ? ?Follow up with your PCP for further evaluation. ? ?Return to the ED for any new/worsening symptoms including worsening pain, inability to swallow, drooling on yourself, muffled voice, or any other new/concerning symptoms.  ? ? ? ? ? ?  ?Eustaquio Maize, PA-C ?04/07/22 1029 ? ?  ?Margette Fast, MD ?04/07/22 1043 ? ?

## 2022-04-07 NOTE — ED Triage Notes (Signed)
Patient here POV from Home. ? ?Endorses Sore Throat for 2 days, Subjective Fever Yesterday. ? ?Associated with Body Aches, Headache, Congestion. ? ?NAD Noted during Triage. A&Ox4. GCS 15. Ambulatory. ?

## 2022-07-15 ENCOUNTER — Emergency Department (HOSPITAL_BASED_OUTPATIENT_CLINIC_OR_DEPARTMENT_OTHER)
Admission: EM | Admit: 2022-07-15 | Discharge: 2022-07-16 | Disposition: A | Discharge status: Home / Self Care | Payer: Medicaid Other | Attending: Emergency Medicine | Admitting: Emergency Medicine

## 2022-07-15 ENCOUNTER — Encounter (HOSPITAL_BASED_OUTPATIENT_CLINIC_OR_DEPARTMENT_OTHER): Payer: Self-pay | Admitting: Emergency Medicine

## 2022-07-15 DIAGNOSIS — J45909 Unspecified asthma, uncomplicated: Secondary | ICD-10-CM | POA: Diagnosis not present

## 2022-07-15 DIAGNOSIS — G43909 Migraine, unspecified, not intractable, without status migrainosus: Secondary | ICD-10-CM | POA: Diagnosis present

## 2022-07-15 DIAGNOSIS — G43809 Other migraine, not intractable, without status migrainosus: Secondary | ICD-10-CM | POA: Insufficient documentation

## 2022-07-15 LAB — CBC
HCT: 38.8 % (ref 36.0–46.0)
Hemoglobin: 12.4 g/dL (ref 12.0–15.0)
MCH: 26.5 pg (ref 26.0–34.0)
MCHC: 32 g/dL (ref 30.0–36.0)
MCV: 82.9 fL (ref 80.0–100.0)
Platelets: 227 10*3/uL (ref 150–400)
RBC: 4.68 MIL/uL (ref 3.87–5.11)
RDW: 13.7 % (ref 11.5–15.5)
WBC: 13.4 10*3/uL — ABNORMAL HIGH (ref 4.0–10.5)
nRBC: 0 % (ref 0.0–0.2)

## 2022-07-15 LAB — BASIC METABOLIC PANEL
Anion gap: 9 (ref 5–15)
BUN: 21 mg/dL — ABNORMAL HIGH (ref 6–20)
CO2: 21 mmol/L — ABNORMAL LOW (ref 22–32)
Calcium: 8.9 mg/dL (ref 8.9–10.3)
Chloride: 105 mmol/L (ref 98–111)
Creatinine, Ser: 0.94 mg/dL (ref 0.44–1.00)
GFR, Estimated: >60 mL/min (ref >60)
Glucose, Bld: 100 mg/dL — ABNORMAL HIGH (ref 70–99)
Potassium: 4 mmol/L (ref 3.5–5.1)
Sodium: 135 mmol/L (ref 135–145)

## 2022-07-15 LAB — MAGNESIUM: Magnesium: 2 mg/dL (ref 1.7–2.4)

## 2022-07-15 MED ORDER — METOCLOPRAMIDE HCL 5 MG/ML IJ SOLN
5.0000 mg | Freq: Once | INTRAMUSCULAR | Status: AC
  Administered 2022-07-15: 5 mg via INTRAVENOUS
  Filled 2022-07-15: qty 2

## 2022-07-15 MED ORDER — SODIUM CHLORIDE 0.9 % IV BOLUS
1000.0000 mL | Freq: Once | INTRAVENOUS | Status: AC
  Administered 2022-07-15: 1000 mL via INTRAVENOUS

## 2022-07-15 MED ORDER — MAGNESIUM SULFATE 2 GM/50ML IV SOLN
2.0000 g | Freq: Once | INTRAVENOUS | Status: AC
  Administered 2022-07-15: 2 g via INTRAVENOUS
  Filled 2022-07-15: qty 50

## 2022-07-15 MED ORDER — ACETAMINOPHEN 325 MG PO TABS
650.0000 mg | ORAL_TABLET | Freq: Once | ORAL | Status: AC
  Administered 2022-07-15: 650 mg via ORAL
  Filled 2022-07-15: qty 2

## 2022-07-15 MED ORDER — SODIUM CHLORIDE 0.9 % IV SOLN: INTRAVENOUS | Status: DC | PRN

## 2022-07-15 MED ORDER — LIDOCAINE 5 % EX PTCH
1.0000 | MEDICATED_PATCH | CUTANEOUS | Status: DC
  Administered 2022-07-15: 1 via TRANSDERMAL
  Filled 2022-07-15: qty 1

## 2022-07-15 MED ORDER — RIZATRIPTAN BENZOATE 10 MG PO TABS: 10.0000 mg | ORAL_TABLET | ORAL | 0 refills | Status: AC | PRN

## 2022-07-15 MED ORDER — DIPHENHYDRAMINE HCL 50 MG/ML IJ SOLN
25.0000 mg | Freq: Once | INTRAMUSCULAR | Status: AC
  Administered 2022-07-15: 25 mg via INTRAVENOUS
  Filled 2022-07-15: qty 1

## 2022-07-15 MED ORDER — HYDROMORPHONE HCL 1 MG/ML IJ SOLN
1.0000 mg | Freq: Once | INTRAMUSCULAR | Status: AC
  Administered 2022-07-15: 1 mg via INTRAVENOUS
  Filled 2022-07-15: qty 1

## 2022-07-15 NOTE — Discharge Instructions (Signed)
It was a pleasure caring for you today in the emergency department. ° °Please return to the emergency department for any worsening or worrisome symptoms. ° ° °

## 2022-07-15 NOTE — ED Triage Notes (Signed)
Migraine since this am. Pt has Hx of same.

## 2022-07-15 NOTE — ED Provider Notes (Signed)
[facility] EMERGENCY DEPARTMENT Provider Note   CSN: [id number] Arrival date & time: 07/15/22  2040     History {Add pertinent medical, surgical, social history, OB history to HPI:1} Chief Complaint  Patient presents with   Migraine    [facility] is a 26 y.o. female.  Patient as above with significant medical history as below, including migraines, asthma, seizures who presents to the ED with complaint of migraine headache.  Patient follows with neurology regarding her migraines.  She ran out of her headache medication.  She developed a headache this morning.  Gradual onset, bitemporal, throbbing, pulsing.  Associate with photophobia and phonophobia.  Nausea without vomiting.  No fevers.  No trauma.  Feels similar to her typical migraines.  She took Excedrin Migraine earlier today which initially helped but then the headache returned.   No fevers, no IV drug use.  No trauma.  No numbness or tingling    Past Medical History:  Diagnosis Date   Asthma    Migraines    Seizures (HCC)     Past Surgical History:  Procedure Laterality Date   CESAREAN SECTION     WRIST SURGERY       The history is provided by the patient. No language interpreter was used.  Migraine Associated symptoms include headaches. Pertinent negatives include no chest pain, no abdominal pain and no shortness of breath.       Home Medications Prior to Admission medications   Medication Sig Start Date End Date Taking? Authorizing Provider  rizatriptan (MAXALT) 10 MG tablet Take 1 tablet (10 mg total) by mouth as needed for migraine. May repeat in 2 hours if needed 07/15/22  Yes Sloan Leiter, DO  nortriptyline (PAMELOR) 10 MG capsule Take by mouth. 09/12/21   [provider]  rizatriptan (MAXALT) 5 MG tablet Take by mouth. 09/12/21   [provider]      Allergies    Patient has no known allergies.    Review of Systems   Review of Systems  Constitutional:  Negative for  activity change and fever.  HENT:  Negative for facial swelling and trouble swallowing.   Eyes:  Positive for photophobia. Negative for discharge and redness.  Respiratory:  Negative for cough and shortness of breath.   Cardiovascular:  Negative for chest pain and palpitations.  Gastrointestinal:  Positive for nausea. Negative for abdominal pain.  Genitourinary:  Negative for dysuria and flank pain.  Musculoskeletal:  Negative for back pain and gait problem.  Skin:  Negative for pallor and rash.  Neurological:  Positive for headaches. Negative for syncope.    Physical Exam Updated Vital Signs BP 109/68 (BP Location: Right Arm)   Pulse 74   Temp 98.5 F (36.9 C) (Oral)   Ht 4\' 11"  (1.499 m)   Wt 54.4 kg   SpO2 100%   BMI 24.24 kg/m  Physical Exam Vitals and nursing note reviewed.  Constitutional:      General: She is not in acute distress.    Appearance: Normal appearance.  HENT:     Head: Normocephalic and atraumatic. No raccoon eyes, Battle's sign, right periorbital erythema or left periorbital erythema.     Jaw: There is normal jaw occlusion. No trismus.     Right Ear: External ear normal.     Left Ear: External ear normal.     Nose: Nose normal.     Mouth/Throat:     Mouth: Mucous membranes are moist.  Eyes:  General: No scleral icterus.       Right eye: No discharge.        Left eye: No discharge.     Extraocular Movements: Extraocular movements intact.     Pupils: Pupils are equal, round, and reactive to light.  Neck:     Meningeal: Brudzinski's sign and Kernig's sign absent.     Comments: No meningismus Cardiovascular:     Rate and Rhythm: Normal rate and regular rhythm.     Pulses: Normal pulses.     Heart sounds: Normal heart sounds.  Pulmonary:     Effort: Pulmonary effort is normal. No respiratory distress.     Breath sounds: Normal breath sounds.  Abdominal:     General: Abdomen is flat.     Tenderness: There is no abdominal tenderness.   Musculoskeletal:        General: Normal range of motion.     Cervical back: Full passive range of motion without pain and normal range of motion.     Right lower leg: No edema.     Left lower leg: No edema.  Skin:    General: Skin is warm and dry.     Capillary Refill: Capillary refill takes less than 2 seconds.  Neurological:     Mental Status: She is alert and oriented to person, place, and time.     GCS: GCS eye subscore is 4. GCS verbal subscore is 5. GCS motor subscore is 6.     Cranial Nerves: Cranial nerves 2-12 are intact. No dysarthria.     Sensory: Sensation is intact.     Motor: Motor function is intact. No tremor.     Coordination: Coordination is intact.  Psychiatric:        Mood and Affect: Mood normal.        Behavior: Behavior normal.     ED Results / Procedures / Treatments   Labs (all labs ordered are listed, but only abnormal results are displayed) Labs Reviewed  CBC - Abnormal; Notable for the following components:      Result Value   WBC 13.4 (*)    All other components within normal limits  BASIC METABOLIC PANEL - Abnormal; Notable for the following components:   CO2 21 (*)    Glucose, Bld 100 (*)    BUN 21 (*)    All other components within normal limits  MAGNESIUM  HCG, SERUM, QUALITATIVE  PREGNANCY, URINE    EKG None  Radiology No results found.  Procedures Procedures  {Document cardiac monitor, telemetry assessment procedure when appropriate:1}  Medications Ordered in ED Medications  magnesium sulfate IVPB 2 g 50 mL (2 g Intravenous New Bag/Given 07/15/22 2312)  lidocaine (LIDODERM) 5 % 1 patch (1 patch Transdermal Patch Applied 07/15/22 2308)  0.9 %  sodium chloride infusion ( Intravenous New Bag/Given 07/15/22 2311)  HYDROmorphone (DILAUDID) injection 1 mg (has no administration in time range)  sodium chloride 0.9 % bolus 1,000 mL (1,000 mLs Intravenous New Bag/Given 07/15/22 2259)  metoCLOPramide (REGLAN) injection 5 mg (5 mg  Intravenous Given 07/15/22 2306)  diphenhydrAMINE (BENADRYL) injection 25 mg (25 mg Intravenous Given 07/15/22 2303)  acetaminophen (TYLENOL) tablet 650 mg (650 mg Oral Given 07/15/22 2302)    ED Course/ Medical Decision Making/ A&P                           Medical Decision Making Amount and/or Complexity of Data Reviewed Labs: ordered.  Risk OTC drugs. Prescription drug management.    CC: ha  This patient presents to the Emergency Department for the above complaint. This involves an extensive number of treatment options and is a complaint that carries with it a high risk of complications and morbidity. Vital signs were reviewed. Serious etiologies considered.  Differential diagnosis includes but is not exclusive to subarachnoid hemorrhage, meningitis, encephalitis, previous head trauma, cavernous venous thrombosis, muscle tension headache, glaucoma, temporal arteritis, migraine or migraine equivalent, etc.  Record review:  Previous records obtained and reviewed prior office visits, prior neuro visits, home medications  Additional history obtained from spouse  Medical and surgical history as noted above.   Work up as above, notable for:  Labs & imaging results that were available during my care of the patient were visualized by me and considered in my medical decision making.  Physical exam as above.   Labs reviewed and are stable. She has mild leukocytosis, afebrile, HDS.   Management: Migraine cocktail IVF  ED Course:     Reassessment:  Symptoms improved   Admission was considered.     Patient presents with headache. Based on the patient's history and physical there is very low clinical suspicion for significant intracranial pathology. The headache was not sudden onset, not maximal at onset, there are no neurologic findings on exam, the patient does not have a fever, the patient does not have any jaw claudication, the patient does not endorse a clotting disorder,  patient denies any trauma or eye pain and the headache is not associated with dizziness, weakness on one side of the body, diplopia, vertigo, slurred speech, or ataxia. Given the extremely low risk of these diagnoses further testing and evaluation for these possibilities does not appear to be indicated at this time.          Social determinants of health include -  Social History   Socioeconomic History   Marital status: Single    Spouse name: Not on file   Number of children: Not on file   Years of education: Not on file   Highest education level: Not on file  Occupational History   Not on file  Tobacco Use   Smoking status: Never   Smokeless tobacco: Never  Vaping Use   Vaping Use: Never used  Substance and Sexual Activity   Alcohol use: Yes    Comment: occ   Drug use: No   Sexual activity: Not on file  Other Topics Concern   Not on file  Social History Narrative   Not on file   Social Determinants of Health   Financial Resource Strain: Not on file  Food Insecurity: Not on file  Transportation Needs: Not on file  Physical Activity: Not on file  Stress: Not on file  Social Connections: Not on file  Intimate Partner Violence: Not on file      This chart was dictated using voice recognition software.  Despite best efforts to proofread,  errors can occur which can change the documentation meaning.   {Document critical care time when appropriate:1} {Document review of labs and clinical decision tools ie heart score, Chads2Vasc2 etc:1}  {Document your independent review of radiology images, and any outside records:1} {Document your discussion with family members, caretakers, and with consultants:1} {Document social determinants of health affecting pt's care:1} {Document your decision making why or why not admission, treatments were needed:1} Final Clinical Impression(s) / ED Diagnoses Final diagnoses:  Other migraine without status migrainosus, not intractable  Rx / DC Orders ED Discharge Orders          Ordered    rizatriptan (MAXALT) 10 MG tablet  As needed        07/15/22 2346

## 2023-01-09 ENCOUNTER — Emergency Department (HOSPITAL_BASED_OUTPATIENT_CLINIC_OR_DEPARTMENT_OTHER)
Admission: EM | Admit: 2023-01-09 | Discharge: 2023-01-09 | Disposition: A | Discharge status: Home / Self Care | Payer: Medicaid Other | Attending: Emergency Medicine | Admitting: Emergency Medicine

## 2023-01-09 ENCOUNTER — Encounter (HOSPITAL_BASED_OUTPATIENT_CLINIC_OR_DEPARTMENT_OTHER): Payer: Self-pay

## 2023-01-09 ENCOUNTER — Other Ambulatory Visit: Payer: Self-pay

## 2023-01-09 DIAGNOSIS — J45909 Unspecified asthma, uncomplicated: Secondary | ICD-10-CM | POA: Insufficient documentation

## 2023-01-09 DIAGNOSIS — J101 Influenza due to other identified influenza virus with other respiratory manifestations: Secondary | ICD-10-CM | POA: Insufficient documentation

## 2023-01-09 DIAGNOSIS — Z1152 Encounter for screening for COVID-19: Secondary | ICD-10-CM | POA: Insufficient documentation

## 2023-01-09 DIAGNOSIS — O99511 Diseases of the respiratory system complicating pregnancy, first trimester: Secondary | ICD-10-CM | POA: Insufficient documentation

## 2023-01-09 DIAGNOSIS — J111 Influenza due to unidentified influenza virus with other respiratory manifestations: Secondary | ICD-10-CM

## 2023-01-09 LAB — RESP PANEL BY RT-PCR (RSV, FLU A&B, COVID)  RVPGX2
Influenza A by PCR: NEGATIVE
Influenza B by PCR: POSITIVE — AB
Resp Syncytial Virus by PCR: NEGATIVE
SARS Coronavirus 2 by RT PCR: NEGATIVE

## 2023-01-09 MED ORDER — ACETAMINOPHEN 500 MG PO TABS
1000.0000 mg | ORAL_TABLET | Freq: Once | ORAL | Status: AC | PRN
  Administered 2023-01-09: 1000 mg via ORAL
  Filled 2023-01-09: qty 2

## 2023-01-09 MED ORDER — ACETAMINOPHEN 325 MG PO TABS: 650.0000 mg | ORAL_TABLET | Freq: Once | ORAL | Status: DC | PRN

## 2023-01-09 NOTE — ED Triage Notes (Addendum)
Pt reports productive cough of green mucous x 3 days associated with lack of appetite. She reports she is 3 months pregnant. She has been taking Allegra and Tessalon pearls. She also reports sore throat and a "sore" abdomen.

## 2023-01-09 NOTE — Discharge Instructions (Addendum)
Evaluation today revealed that you do have the flu.  Your symptoms are consistent with this diagnosis.  Recommend they continue conservative treatment at home which includes most importantly hydration but also rest.  Also advised that you follow-up with your OB/GYN provider to let them know that you do have the flu.  If you have new chest pain or shortness of breath or any other concerning symptom please return the emergency department for further evaluation.

## 2023-01-09 NOTE — ED Provider Notes (Addendum)
[facility] Provider Note   CSN: [id number] Arrival date & time: 01/09/23  1632     History  Chief Complaint  Patient presents with   Cough   HPI [facility] is a 27 y.o. female with asthma, seizures and migraines and presently 3 months pregnant presenting for cough congestion and fever.  Symptoms started 3 days ago.  She is also endorsing sore throat.  She states she went to the pharmacy and pharmacist recommended that she take Robitussin and Allegra.  States Robitussin has been helping with the cough.  Denies chest pain and shortness of breath.   Cough Associated symptoms: fever        Home Medications Prior to Admission medications   Medication Sig Start Date End Date Taking? Authorizing Provider  nortriptyline (PAMELOR) 10 MG capsule Take by mouth. 09/12/21   [provider]  rizatriptan (MAXALT) 10 MG tablet Take 1 tablet (10 mg total) by mouth as needed for migraine. May repeat in 2 hours if needed 07/15/22   Jeanell Sparrow, DO  rizatriptan (MAXALT) 5 MG tablet Take by mouth. 09/12/21   [provider]      Allergies    Patient has no known allergies.    Review of Systems   Review of Systems  Constitutional:  Positive for fever.  HENT:  Positive for congestion.   Respiratory:  Positive for cough.     Physical Exam   Vitals:   01/09/23 1706  BP: 106/75  Pulse: 98  Resp: 20  Temp: (!) 103 F (39.4 C)  SpO2: 99%    CONSTITUTIONAL:  ill-appearing, NAD NEURO:  Alert and oriented x 3, CN 3-12 grossly intact EYES:  eyes equal and reactive ENT/NECK:  Supple, no stridor  CARDIO:  regular rate and rhythm, appears well-perfused  PULM:  No respiratory distress, CTAB MSK/SPINE:  No gross deformities, no edema, moves all extremities  SKIN:  no rash, atraumatic   *Additional and/or pertinent findings included in MDM below    ED Results / Procedures / Treatments   Labs (all labs ordered are  listed, but only abnormal results are displayed) Labs Reviewed  RESP PANEL BY RT-PCR (RSV, FLU A&B, COVID)  RVPGX2 - Abnormal; Notable for the following components:      Result Value   Influenza B by PCR POSITIVE (*)    All other components within normal limits  GROUP A STREP BY PCR    EKG None  Radiology No results found.  Procedures Procedures    Medications Ordered in ED Medications  acetaminophen (TYLENOL) tablet 1,000 mg (1,000 mg Oral Given 01/09/23 1732)    ED Course/ Medical Decision Making/ A&P Clinical Course as of 01/09/23 1852  Tue Jan 09, 2023  1834 Influenza B By PCR(!): POSITIVE [JR]    Clinical Course User Index [JR] Harriet Pho, PA-C                             Medical Decision Making Amount and/or Complexity of Data Reviewed Labs:  Decision-making details documented in ED Course.  Risk OTC drugs.   27 year old female who is ill-appearing, febrile but otherwise hemodynamically stable presenting for cough congestion and fever.  Physical exam notable for fever but otherwise reassuring.  Differential diagnosis for this complaint includes flu, COVID, RSV, pneumonia, and sepsis.  Respiratory PCR revealed that she is positive for flu.  Symptoms are consistent with this  diagnosis.  Considered sepsis but unlikely given vitals were within normal limits aside from her fever.  Also re checked her temperature during my encounter and it was 98.7 F after she was given Tylenol.  Also give her Gatorade for rehydration and fluid challenge which was without complication.  Discussed return precautions.  Also advised her to reach out to her OB/GYN provider to let her know that she has the flu.  Advised conservative treatment at home.          Final Clinical Impression(s) / ED Diagnoses Final diagnoses:  Influenza    Rx / DC Orders ED Discharge Orders     None            Harriet Pho, PA-C 01/09/23 1855    Lennice Sites, DO 01/09/23  1855

## 2023-01-10 LAB — GROUP A STREP BY PCR: Group A Strep by PCR: NOT DETECTED

## 2023-09-11 ENCOUNTER — Emergency Department (HOSPITAL_BASED_OUTPATIENT_CLINIC_OR_DEPARTMENT_OTHER)
Admission: EM | Admit: 2023-09-11 | Discharge: 2023-09-11 | Discharge status: Against Medical Advice | Payer: Medicaid Other | Attending: Emergency Medicine | Admitting: Emergency Medicine

## 2023-09-11 ENCOUNTER — Encounter (HOSPITAL_BASED_OUTPATIENT_CLINIC_OR_DEPARTMENT_OTHER): Payer: Self-pay | Admitting: Emergency Medicine

## 2023-09-11 ENCOUNTER — Emergency Department (HOSPITAL_BASED_OUTPATIENT_CLINIC_OR_DEPARTMENT_OTHER): Payer: Medicaid Other

## 2023-09-11 ENCOUNTER — Other Ambulatory Visit: Payer: Self-pay

## 2023-09-11 DIAGNOSIS — Z20822 Contact with and (suspected) exposure to covid-19: Secondary | ICD-10-CM | POA: Insufficient documentation

## 2023-09-11 DIAGNOSIS — G529 Cranial nerve disorder, unspecified: Secondary | ICD-10-CM | POA: Diagnosis not present

## 2023-09-11 DIAGNOSIS — H532 Diplopia: Secondary | ICD-10-CM | POA: Diagnosis present

## 2023-09-11 LAB — COMPREHENSIVE METABOLIC PANEL
ALT: 8 U/L (ref 0–44)
AST: 11 U/L — ABNORMAL LOW (ref 15–41)
Albumin: 4.1 g/dL (ref 3.5–5.0)
Alkaline Phosphatase: 53 U/L (ref 38–126)
Anion gap: 8 (ref 5–15)
BUN: 11 mg/dL (ref 6–20)
CO2: 25 mmol/L (ref 22–32)
Calcium: 8.9 mg/dL (ref 8.9–10.3)
Chloride: 104 mmol/L (ref 98–111)
Creatinine, Ser: 0.87 mg/dL (ref 0.44–1.00)
GFR, Estimated: >60 mL/min (ref >60)
Glucose, Bld: 98 mg/dL (ref 70–99)
Potassium: 3.4 mmol/L — ABNORMAL LOW (ref 3.5–5.1)
Sodium: 137 mmol/L (ref 135–145)
Total Bilirubin: 0.5 mg/dL (ref 0.3–1.2)
Total Protein: 6.7 g/dL (ref 6.5–8.1)

## 2023-09-11 LAB — CBC WITH DIFFERENTIAL/PLATELET
Abs Immature Granulocytes: 0.04 10*3/uL (ref 0.00–0.07)
Basophils Absolute: 0 10*3/uL (ref 0.0–0.1)
Basophils Relative: 0 %
Eosinophils Absolute: 0 10*3/uL (ref 0.0–0.5)
Eosinophils Relative: 0 %
HCT: 30.5 % — ABNORMAL LOW (ref 36.0–46.0)
Hemoglobin: 9.3 g/dL — ABNORMAL LOW (ref 12.0–15.0)
Immature Granulocytes: 0 %
Lymphocytes Relative: 13 %
Lymphs Abs: 1.4 10*3/uL (ref 0.7–4.0)
MCH: 23 pg — ABNORMAL LOW (ref 26.0–34.0)
MCHC: 30.5 g/dL (ref 30.0–36.0)
MCV: 75.3 fL — ABNORMAL LOW (ref 80.0–100.0)
Monocytes Absolute: 0.6 10*3/uL (ref 0.1–1.0)
Monocytes Relative: 6 %
Neutro Abs: 8.8 10*3/uL — ABNORMAL HIGH (ref 1.7–7.7)
Neutrophils Relative %: 81 %
Platelets: 341 10*3/uL (ref 150–400)
RBC: 4.05 MIL/uL (ref 3.87–5.11)
RDW: 17.1 % — ABNORMAL HIGH (ref 11.5–15.5)
WBC: 11 10*3/uL — ABNORMAL HIGH (ref 4.0–10.5)
nRBC: 0 % (ref 0.0–0.2)

## 2023-09-11 LAB — URINALYSIS, ROUTINE W REFLEX MICROSCOPIC
Bilirubin Urine: NEGATIVE
Glucose, UA: NEGATIVE mg/dL
Hgb urine dipstick: NEGATIVE
Ketones, ur: NEGATIVE mg/dL
Leukocytes,Ua: NEGATIVE
Nitrite: NEGATIVE
Protein, ur: NEGATIVE mg/dL
Specific Gravity, Urine: 1.025 (ref 1.005–1.030)
pH: 6.5 (ref 5.0–8.0)

## 2023-09-11 LAB — RESP PANEL BY RT-PCR (RSV, FLU A&B, COVID)  RVPGX2
Influenza A by PCR: NEGATIVE
Influenza B by PCR: NEGATIVE
Resp Syncytial Virus by PCR: NEGATIVE
SARS Coronavirus 2 by RT PCR: NEGATIVE

## 2023-09-11 LAB — PREGNANCY, URINE: Preg Test, Ur: NEGATIVE

## 2023-09-11 MED ORDER — ONDANSETRON HCL 4 MG/2ML IJ SOLN: 4.0000 mg | Freq: Once | INTRAMUSCULAR | Status: DC

## 2023-09-11 MED ORDER — DIPHENHYDRAMINE HCL 50 MG/ML IJ SOLN
25.0000 mg | Freq: Once | INTRAMUSCULAR | Status: AC
  Administered 2023-09-11: 25 mg via INTRAVENOUS
  Filled 2023-09-11: qty 1

## 2023-09-11 MED ORDER — LACTATED RINGERS IV BOLUS
1000.0000 mL | Freq: Once | INTRAVENOUS | Status: AC
  Administered 2023-09-11: 1000 mL via INTRAVENOUS

## 2023-09-11 MED ORDER — DIAZEPAM 5 MG PO TABS: 10.0000 mg | ORAL_TABLET | Freq: Once | ORAL | Status: DC

## 2023-09-11 MED ORDER — KETOROLAC TROMETHAMINE 15 MG/ML IJ SOLN: 15.0000 mg | Freq: Once | INTRAMUSCULAR | Status: DC

## 2023-09-11 MED ORDER — DIAZEPAM 5 MG PO TABS
10.0000 mg | ORAL_TABLET | Freq: Once | ORAL | Status: AC
  Administered 2023-09-11: 10 mg via ORAL
  Filled 2023-09-11: qty 2

## 2023-09-11 MED ORDER — PROCHLORPERAZINE EDISYLATE 10 MG/2ML IJ SOLN
10.0000 mg | Freq: Once | INTRAMUSCULAR | Status: AC
  Administered 2023-09-11: 10 mg via INTRAVENOUS
  Filled 2023-09-11: qty 2

## 2023-09-11 NOTE — ED Triage Notes (Addendum)
Pt woke up this morning with crossed eyes.  Last known normal was 10 pm.  Pt has history of being admitted for same within the last year.  Pt has headache for 5 days.  Pt has not been taking any meds for MS but has Dimethyl Fumarate ordered

## 2023-09-11 NOTE — ED Provider Notes (Signed)
[facility] Provider Note   CSN: [id number] Arrival date & time: 09/11/23  4098     History  Chief Complaint  Patient presents with   Eye Problem    Kathleen Moss is a 27 y.o. female here with diplopia.  She started having a frontal headache described as a migraine 5 days ago.  Since then, she has had fever/chills, nausea and vomiting.  Also having dizziness and lightheadedness with weakness.  When she woke up this morning, she noticed that her eyes were crossed so she came in to be seen. Denies eye pain, oscillating scotoma.  Does have some blurred vision in the right eye.  Denies any numbness or tingling.  No word finding or speech difficulty.  No presyncopal or syncopal symptoms.   She has a presumed diagnosis of multiple sclerosis.  On 07/16/2022, she was admitted for diplopia and headache at [facility].  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  MRI brain showed 1.3 cm x 0.9cm focus in dorsal aspect of the pons and no hemorrhage. Possibly demyelinating plaques/low-grade neoplasm. Follows with [facility] neurology for MS, migraine with aura.  She has not been taking any MS treatment as she is recently postpartum, and medications were not started during her pregnancy. There is a family history of multiple sclerosis on her paternal side-cousin.   Eye Problem Associated symptoms: nausea and vomiting   Associated symptoms: no numbness        Home Medications Prior to Admission medications   Medication Sig Start Date End Date Taking? Authorizing Provider  nortriptyline (PAMELOR) 10 MG capsule Take by mouth. 09/12/21   [provider]  rizatriptan (MAXALT) 10 MG tablet Take 1 tablet (10 mg total) by mouth as needed for migraine. May repeat in 2 hours if needed 07/15/22   Sloan Leiter, DO  rizatriptan (MAXALT) 5 MG tablet Take by mouth. 09/12/21   [provider]       Allergies    Patient has no known allergies.    Review of Systems   Review of Systems  Constitutional:  Positive for chills.  HENT:  Positive for rhinorrhea.   Eyes:  Positive for visual disturbance. Negative for pain.  Respiratory:  Negative for cough and shortness of breath.   Gastrointestinal:  Positive for nausea and vomiting.  Neurological:  Positive for dizziness and light-headedness. Negative for seizures, syncope, speech difficulty and numbness.  Psychiatric/Behavioral:  Negative for confusion.     Physical Exam Updated Vital Signs BP (!) 142/81 (BP Location: Right Arm)   Pulse 73   Temp 98.8 F (37.1 C) (Oral)   Resp 18   Ht 4\' 11"  (1.499 m)   Wt 59 kg   LMP 09/09/2023   SpO2 99%   Breastfeeding Unknown   BMI 26.26 kg/m  Physical Exam Constitutional:      Appearance: Normal appearance.  HENT:     Mouth/Throat:     Mouth: Mucous membranes are dry.     Pharynx: Oropharynx is clear.  Eyes:     Conjunctiva/sclera: Conjunctivae normal.  Cardiovascular:     Rate and Rhythm: Normal rate and regular rhythm.  Pulmonary:     Effort: Pulmonary effort is normal.     Breath sounds: Normal breath sounds.  Abdominal:     General: Abdomen is flat.     Palpations: Abdomen is soft.  Musculoskeletal:        General: Normal range of  motion.  Skin:    General: Skin is warm and dry.  Neurological:     Mental Status: She is alert and oriented to person, place, and time.     Cranial Nerves: Cranial nerve deficit present.     Sensory: No sensory deficit.     Motor: No weakness.     Gait: Gait normal.     Comments: Right cranial nerve palsy noted with inward/downward right eye deviation, but able to track past midline. No horizontal or vertical nystagmus. No left eye movement abnormalities.  Psychiatric:        Behavior: Behavior normal.     Comments: Intermittently tearful     ED Results / Procedures / Treatments   Labs (all labs ordered are listed, but only  abnormal results are displayed) Labs Reviewed  CBC WITH DIFFERENTIAL/PLATELET - Abnormal; Notable for the following components:      Result Value   WBC 11.0 (*)    Hemoglobin 9.3 (*)    HCT 30.5 (*)    MCV 75.3 (*)    MCH 23.0 (*)    RDW 17.1 (*)    Neutro Abs 8.8 (*)    All other components within normal limits  COMPREHENSIVE METABOLIC PANEL - Abnormal; Notable for the following components:   Potassium 3.4 (*)    AST 11 (*)    All other components within normal limits  RESP PANEL BY RT-PCR (RSV, FLU A&B, COVID)  RVPGX2  URINALYSIS, ROUTINE W REFLEX MICROSCOPIC  PREGNANCY, URINE    EKG None  Radiology No results found.  Procedures Procedures    Medications Ordered in ED Medications  diazepam (VALIUM) tablet 10 mg (has no administration in time range)  lactated ringers bolus 1,000 mL (0 mLs Intravenous Stopped 09/11/23 1145)  diphenhydrAMINE (BENADRYL) injection 25 mg (25 mg Intravenous Given 09/11/23 1003)  prochlorperazine (COMPAZINE) injection 10 mg (10 mg Intravenous Given 09/11/23 1004)    ED Course/ Medical Decision Making/ A&P                                 Medical Decision Making 27 year old female with presumed MS, presented with right eye gaze palsy and headache with dizziness.   Hemodynamically stable, with elevated temperature 100.1 F.  Reassuringly, no other focal neurological deficits on exam.  She has normal strength and sensation in bilateral upper and lower extremities, no gait abnormalities. Follows with neurology but not currently on immunosuppression or steroid therapy. CBC with slightly elevated leukocytosis, microcytic anemia fitting with patient's history of IDA, negative COVID, flu, RSV. Differential includes complicated migraine, acute multiple sclerosis exacerbation, MS pseudo flare, viral or bacterial illness  I spoke with Dr. Iver Nestle with neurology who recommended MRI brain with and without contrast, and preferably to include C-spine and  T-spine. She discussed that in the postpartum period, the patient is at higher risk for MS exacerbations.  11:30 AM: On reassessment, patient still reports headache with minimal improvement s/p IV Benadryl, IV Compazine, IV fluids.  She has continued nerve palsy with inward and downward eye deviation.  Discussed ED to ED transfer.  Patient and mother wish to transfer via POV. Dr. Adela Lank discussed with Dr. Jacqulyn Bath at [facility] who accepted transfer. MRI brain, cervical spine and thoracic spine ordered with 10 mg diazepam to be administered prior for oral sedation.  Amount and/or Complexity of Data Reviewed Labs: ordered. Radiology: ordered.  Risk Prescription drug management.  Final Clinical Impression(s) / ED Diagnoses Final diagnoses:  None    Rx / DC Orders ED Discharge Orders     None         Darral Dash, DO 09/11/23 1201    Melene Plan, DO 09/11/23 1204

## 2023-09-11 NOTE — Plan of Care (Signed)
These are curbside recommendations based upon the information readily available in the chart on brief review as well as history and examination information provided to me by requesting provider and do not replace a full detailed consult  Discussed that current symptoms could reflect pseudo flare in the setting of infection (return of prior symptoms), versus possibly new flare given patient is in a high risk period (Postpartum)  Recommend MRI brain with and without contrast to clarify, as the patient has not had C-spine and T-spine imaging it would also be reasonable to obtain this imaging for baseline multiple sclerosis staging Neurological consultation can be obtained if MRI brain is positive for contrast-enhancing lesion, otherwise would treat infection  Spoke with Dr Melissa Noon, Nolberto Hanlon  She reports this is a 27-month postpartum patient followed by [facility] neurology for concern for multiple sclerosis, not yet started on dimethyl fumarate, presenting with eye movement abnormalities identical to initial eye movement abnormalities leading to her admission 07/16/2022 at [facility]. She is also febrile here and feeling unwell   Records reviewed:  Current vital signs: BP (!) 148/85   Pulse 91   Temp 100.1 F (37.8 C) (Oral)   Resp 18   Ht 4\' 11"  (1.499 m)   Wt 59 kg   LMP 09/09/2023   SpO2 100%   Breastfeeding Unknown   BMI 26.26 kg/m  Vital signs in last 24 hours: Temp:  [100.1 F (37.8 C)] 100.1 F (37.8 C) (09/24 0834) Pulse Rate:  [91] 91 (09/24 0834) Resp:  [18] 18 (09/24 0834) BP: (148)/(85) 148/85 (09/24 0834) SpO2:  [100 %] 100 % (09/24 0834) Weight:  [59 kg] 59 kg (09/24 0848)   From last neurology note 02/14/2023 "The patient is a 27 y.o. right handed Black or African American   Female  With Multiple Sclerosis, diagnosed at [facility], started 07/14/22, with headache, then got diplopia, left eye deviated, got Solumedrol X 3 days. Nausea with headaches. Has migraines now  not too bad..She never started Tecfidera due to pregnancy, now about 4.5 months gestation."   Imaging reports from 06/2022 reviewed, no radiographs available within our system  MRI brain performed at that time without contrast 1.3 x 0.9 cm focus of abnormal T2 and FLAIR signal in the dorsal aspect of the pons on image 19 of series 7 with a similar-appearing focus of abnormal signal in the anterior medulla oblongata on image 41 of series 7.. There is no associated hemorrhage. There is no edema mass effect or midline shift. There is no abnormal signal on the DWI sequence to suggest acute infarction.   Follow-up MRI brain with and without contrast 1.  Redemonstrated abnormal T2 hyperintense signal extending from the dorsal aspect of the pons into the medulla, slightly expansile appearance of the lower ventral medulla. These findings are nonspecific with a broad differential diagnosis. Infectious etiologies including a nonspecific brainstem encephalitis (for example Listeria encephalitis can present with findings in the brainstem, as well as other bacterial and viral etiologies). Demyelinating disease could be a diagnostic consideration. Vascular/inflammatory etiologies are included including Behcet disease, nonspecific vasculitis, neurosarcoid etc. Brainstem neoplasm not excluded.  2.  There is a small subtle peripheral area of enhancement at the dorsal aspect of the pons measuring approximately 2.3 mm in axial dimension. This corresponds to an area of restricted diffusion on the same day noncontrast MRI brain, in the setting of a infectious process this does raise concern for a tiny abscess. The enhancement could also be related to the additional etiologies  as discussed above.  3.  Additional areas of enhancement noted along the ventral lateral aspects of the medulla bilaterally.  4.  Recommend clinical correlation, CSF sampling, and continued close interval follow-up MRI brain with and without contrast  examination.  5.  Incidental note that the pituitary demonstrates a convex superior margin which may represent pituitary hyperplasia, other nonspecific pituitary lesion not excluded. Not fully evaluated on this exam. Attention on follow-up.   MRI cervical spine with and without contrast 1.  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 There some straightening of normal cervical lordosis.  2.  Abnormal signal intensity in the lower pons, dorsally. Uncertain significance, but probably related to the patient's internuclear ophthalmoplegia (may affect the dorsal pontine eye movement centers, and related tracts.)

## 2023-09-11 NOTE — ED Notes (Signed)
Pt educated on the importance of going straight to [employer]. IV has been wrapped and advised patient not to mess with it. Pt sent POV with sister driving.

## 2023-09-11 NOTE — Discharge Instructions (Addendum)
Please drive immediately to the [facility] Emergency Department for further evaluation.  [facility] [address] [place], [place] [address]  We have spoken with the ER and they are expecting you.

## 2023-09-11 NOTE — ED Notes (Signed)
Left property to another hospital
